# Patient Record
Sex: Female | Born: 1955 | Race: White | Hispanic: No | Smoking: Former smoker
Health system: Southern US, Community
[De-identification: ages and names within clinical notes are randomized; demographics above are authoritative.]

## PROBLEM LIST (undated history)

## (undated) DIAGNOSIS — N3941 Urge incontinence: Secondary | ICD-10-CM

## (undated) DIAGNOSIS — Z1371 Encounter for nonprocreative screening for genetic disease carrier status: Secondary | ICD-10-CM

## (undated) DIAGNOSIS — N3281 Overactive bladder: Secondary | ICD-10-CM

## (undated) DIAGNOSIS — I10 Essential (primary) hypertension: Secondary | ICD-10-CM

## (undated) DIAGNOSIS — M1990 Unspecified osteoarthritis, unspecified site: Secondary | ICD-10-CM

## (undated) DIAGNOSIS — R638 Other symptoms and signs concerning food and fluid intake: Secondary | ICD-10-CM

## (undated) DIAGNOSIS — N952 Postmenopausal atrophic vaginitis: Secondary | ICD-10-CM

## (undated) DIAGNOSIS — IMO0002 Reserved for concepts with insufficient information to code with codable children: Secondary | ICD-10-CM

## (undated) DIAGNOSIS — G4733 Obstructive sleep apnea (adult) (pediatric): Secondary | ICD-10-CM

## (undated) DIAGNOSIS — G5712 Meralgia paresthetica, left lower limb: Secondary | ICD-10-CM

## (undated) DIAGNOSIS — Z01818 Encounter for other preprocedural examination: Secondary | ICD-10-CM

## (undated) DIAGNOSIS — I890 Lymphedema, not elsewhere classified: Secondary | ICD-10-CM

## (undated) DIAGNOSIS — N816 Rectocele: Secondary | ICD-10-CM

## (undated) HISTORY — DX: Postmenopausal atrophic vaginitis: N95.2

## (undated) HISTORY — DX: Rectocele: N81.6

## (undated) HISTORY — DX: Reserved for concepts with insufficient information to code with codable children: IMO0002

## (undated) HISTORY — DX: Overactive bladder: N32.81

## (undated) HISTORY — DX: Urge incontinence: N39.41

## (undated) HISTORY — DX: Encounter for nonprocreative screening for genetic disease carrier status: Z13.71

## (undated) HISTORY — PX: TUBAL LIGATION: SHX77

## (undated) HISTORY — DX: Other symptoms and signs concerning food and fluid intake: R63.8

---

## 2002-03-13 ENCOUNTER — Encounter: Payer: Self-pay | Admitting: Internal Medicine

## 2003-04-14 ENCOUNTER — Encounter: Payer: Self-pay | Admitting: Internal Medicine

## 2005-07-06 ENCOUNTER — Ambulatory Visit: Payer: Self-pay | Admitting: Internal Medicine

## 2005-07-16 ENCOUNTER — Ambulatory Visit: Payer: Self-pay | Admitting: Internal Medicine

## 2005-07-16 ENCOUNTER — Encounter: Payer: Self-pay | Admitting: Internal Medicine

## 2007-03-06 ENCOUNTER — Encounter: Payer: Self-pay | Admitting: Internal Medicine

## 2007-04-03 ENCOUNTER — Ambulatory Visit: Payer: Self-pay | Admitting: Internal Medicine

## 2007-04-03 DIAGNOSIS — R252 Cramp and spasm: Secondary | ICD-10-CM

## 2007-04-18 ENCOUNTER — Encounter: Payer: Self-pay | Admitting: Internal Medicine

## 2007-04-20 ENCOUNTER — Encounter (INDEPENDENT_AMBULATORY_CARE_PROVIDER_SITE_OTHER): Payer: Self-pay | Admitting: *Deleted

## 2007-10-12 ENCOUNTER — Ambulatory Visit: Payer: Self-pay | Admitting: General Practice

## 2008-12-03 ENCOUNTER — Ambulatory Visit: Payer: Self-pay | Admitting: Family Medicine

## 2008-12-03 DIAGNOSIS — R42 Dizziness and giddiness: Secondary | ICD-10-CM

## 2008-12-03 DIAGNOSIS — H6120 Impacted cerumen, unspecified ear: Secondary | ICD-10-CM

## 2008-12-09 ENCOUNTER — Ambulatory Visit: Payer: Self-pay | Admitting: Family Medicine

## 2009-01-27 ENCOUNTER — Telehealth (INDEPENDENT_AMBULATORY_CARE_PROVIDER_SITE_OTHER): Payer: Self-pay | Admitting: Internal Medicine

## 2009-01-28 ENCOUNTER — Ambulatory Visit: Payer: Self-pay | Admitting: Family Medicine

## 2009-02-06 ENCOUNTER — Encounter: Payer: Self-pay | Admitting: Internal Medicine

## 2009-03-03 ENCOUNTER — Telehealth: Payer: Self-pay | Admitting: Internal Medicine

## 2009-03-11 ENCOUNTER — Encounter: Payer: Self-pay | Admitting: Internal Medicine

## 2009-03-13 ENCOUNTER — Encounter: Payer: Self-pay | Admitting: Internal Medicine

## 2009-04-16 ENCOUNTER — Ambulatory Visit: Payer: Self-pay | Admitting: Internal Medicine

## 2009-04-17 ENCOUNTER — Ambulatory Visit: Payer: Self-pay | Admitting: Family Medicine

## 2009-04-17 DIAGNOSIS — N390 Urinary tract infection, site not specified: Secondary | ICD-10-CM | POA: Insufficient documentation

## 2009-04-17 LAB — CONVERTED CEMR LAB
Bacteria, UA: 0
Bilirubin Urine: NEGATIVE
Glucose, Urine, Semiquant: NEGATIVE
Ketones, urine, test strip: NEGATIVE
Specific Gravity, Urine: 1.015
pH: 7

## 2009-04-28 ENCOUNTER — Ambulatory Visit: Payer: Self-pay | Admitting: Internal Medicine

## 2009-06-11 ENCOUNTER — Ambulatory Visit: Payer: Self-pay | Admitting: Internal Medicine

## 2009-06-11 DIAGNOSIS — G479 Sleep disorder, unspecified: Secondary | ICD-10-CM | POA: Insufficient documentation

## 2009-06-22 ENCOUNTER — Encounter: Payer: Self-pay | Admitting: Internal Medicine

## 2009-07-07 ENCOUNTER — Telehealth: Payer: Self-pay | Admitting: Family Medicine

## 2009-12-11 ENCOUNTER — Ambulatory Visit: Payer: Self-pay | Admitting: Family Medicine

## 2010-06-22 ENCOUNTER — Ambulatory Visit
Admission: RE | Admit: 2010-06-22 | Discharge: 2010-06-22 | Payer: Self-pay | Source: Home / Self Care | Attending: Family Medicine | Admitting: Family Medicine

## 2010-07-14 NOTE — Progress Notes (Signed)
Summary: Wants preventive med for flu  Phone Note Call from Patient Call back at [phone]   Caller: Patient Call For: Caitlin Salt MD Summary of Call: This is Dr. Karle Starch pt but she wants to get started on med today. Pt had flu shot and has been exposed to co worker who has confirmed diagnosis of the flu. Pt would like med called in as preventative med because her father has recurrent pheumonia and he does not need to be around someone with the flu. Pt uses [facility] [phone]. Please advise.  Initial call taken by: Lewanda Rife LPN,  July 07, 2009 2:52 PM  Follow-up for Phone Call        px written on EMR for call in - tamiflu for prophylaxis  f/u if any flu symptoms  Follow-up by: Judith Part MD,  July 07, 2009 3:28 PM  Additional Follow-up for Phone Call Additional follow up Details #1::        Med called to [facility], pt advised. Additional Follow-up by: Lowella Petties CMA,  July 07, 2009 3:40 PM    New/Updated Medications: TAMIFLU 75 MG CAPS (OSELTAMIVIR PHOSPHATE) 1 by mouth once daily for 10 days Prescriptions: TAMIFLU 75 MG CAPS (OSELTAMIVIR PHOSPHATE) 1 by mouth once daily for 10 days  #10 x 0   Entered and Authorized by:   Judith Part MD   Signed by:   Judith Part MD on 07/07/2009   Method used:   Telephoned to ...       [facility]* (retail)       [address]       [place], [place]  [address]       Ph: [phone]       Fax: [phone]   RxID:   [id number]

## 2010-07-14 NOTE — Letter (Signed)
Summary: Results Follow up Letter  [facility]  [address] [place], [place] [address]   Phone: [phone]  Fax: [phone]    06/22/2009 MRN: [id number]  Caitlin Romero [address] [place], [place]  [address]  Dear Ms. Sciuto,  The following are the results of your recent test(s):  Test         Result    Pap Smear:        Normal __X___  Not Normal _____ Comments: ______________________________________________________ Cholesterol: LDL(Bad cholesterol):         Your goal is less than:         HDL (Good cholesterol):       Your goal is more than: Comments:  ______________________________________________________ Mammogram:        Normal _____  Not Normal _____ Comments:  ___________________________________________________________________ Hemoccult:        Normal _____  Not normal _______ Comments:    _____________________________________________________________________ Other Tests:    We routinely do not discuss normal results over the telephone.  If you desire a copy of the results, or you have any questions about this information we can discuss them at your next office visit.   Sincerely,     Tillman Abide, MD

## 2010-07-14 NOTE — Letter (Signed)
Summary: Letter Regarding Biometric Screening Results/[facility]  Letter Regarding Biometric Screening Results/[facility]   Imported By: Lanelle Bal 06/16/2009 10:06:22  _____________________________________________________________________  External Attachment:    Type:   Image     Comment:   External Document

## 2010-07-14 NOTE — Assessment & Plan Note (Signed)
Summary: BUG BITE/JBB   Vital Signs:  Patient Profile:   55 Years Old Female CC:      Insect Bite. / rwt Height:     63 inches Weight:      182 pounds BMI:     32.36 Temp:     97.8 degrees F oral Pulse rate:   76 / minute Pulse rhythm:   regular Resp:     18 per minute BP sitting:   124 / 86  (left arm)  Pt. in pain?   yes    Location:   arm    Intensity:   2    Type:       aching  Vitals Entered By: Levonne Spiller EMT-P (December 11, 2009 3:32 PM)              Is Patient Diabetic? No      Current Allergies: No known allergies History of Present Illness History from: patient Reason for visit: see chief complaint Chief Complaint: Insect Bite. / rwt History of Present Illness: About 3 days ago patient felt something bite her on her left forearm. She did not see  what it was but it stung. Afterwards it began to itch. She put benadryl on it. By the next day the itching was more intense and today she woke up with swelling and redness around the bite area.   REVIEW OF SYSTEMS        Physical Exam General appearance: well developed, well nourished, no acute distress Skin: m-p rash with erythematous raised patch surrounding it.  MSE: oriented to time, place, and person Assessment New Problems: INSECT BITE, INFECTED (ICD-919.5)   Plan New Medications/Changes: LEVAQUIN 500 MG TABS (LEVOFLOXACIN) 1 by mouth daily for 7 days  #7 x 0, 12/11/2009, Tacey Ruiz MD LEVAQUIN 500 MG TABS (LEVOFLOXACIN) 1 by mouth daily for 7 days  #7 x 0, 12/11/2009, Tacey Ruiz MD  New Orders: Est. Patient Level II [[address]] Planning Comments:   Continue using hydrocortisone to area for itching. If it worsens or continues to spread - call or return.    Diagnoses and expected course of recovery discussed and will return if not improved as expected or if the condition worsens. Patient and/or caregiver verbalized understanding.  Prescriptions: LEVAQUIN 500 MG TABS (LEVOFLOXACIN) 1 by mouth daily for 7  days  #7 x 0   Entered and Authorized by:   Tacey Ruiz MD   Signed by:   Tacey Ruiz MD on 12/11/2009   Method used:   Electronically to        [facility]* (retail)       [address]       [place], [place]  [address]       Ph: [phone]       Fax: [phone]   RxID:   [id number] LEVAQUIN 500 MG TABS (LEVOFLOXACIN) 1 by mouth daily for 7 days  #7 x 0   Entered and Authorized by:   Tacey Ruiz MD   Signed by:   Tacey Ruiz MD on 12/11/2009   Method used:   Electronically to        [facility]* (retail)       [address]       [place], [place]  [address]       Ph: [phone]       Fax: [phone]   RxID:   [id number]  Orders Added: 1)  Est. Patient Level II [[address]]  The risks, benefits and possible side effects of the treatments and tests were explained clearly to the patient and the patient verbalized understanding.  The patient was informed that there is no on-call provider or services available at this clinic during off-hours (when the clinic is closed).  If the patient developed a problem or concern that required immediate attention, the patient was advised to go the the nearest available urgent care or emergency department for medical care.  The patient verbalized understanding.

## 2010-07-15 NOTE — Assessment & Plan Note (Signed)
Summary: EAR HURTING-RIGH,COUGH, CONGESTED/JBB   Vital Signs:  Patient profile:   55 year old female Height:      62 inches Weight:      188 pounds BMI:     34.51 O2 Sat:      98 % on Room air Temp:     97.1 degrees F oral Pulse rate:   73 / minute Pulse rhythm:   regular Resp:     20 per minute BP sitting:   144 / 88  (right arm)  Vitals Entered By: Levonne Spiller EMT-P (June 22, 2010 4:07 PM)  O2 Flow:  Room air CC: URI Is Patient Diabetic? No Pain Assessment Patient in pain? yes     Location: head Intensity: 2 Type: aching Onset of pain  Gradual  Does patient need assistance? Functional Status Self care Ambulation Normal   Chief Complaint:  URI.  History of Present Illness: The patient presented today because for the last 2 weeks she has been having some sinus pain, pressure and drainage.  She has had no fever or chills.  She feels like fluid in the head.  She has congestion and decreased hearing in the right ear.  She is having a dry cough at night.  She reports nasal congestion.  She is reporting sinus postnasal drainage and bad taste and halitosis.  No sneezing reported.    Allergies (verified): No Known Drug Allergies  Past History:  Past Medical History: Unremarkable  Past Surgical History: Reviewed history from 03/06/2007 and no changes required. Vaginal deliveries x 2 Tubal ligation (after 2nd) Lasik 2003 Mole removal- back negative 2003  Family History: Reviewed history from 03/06/2007 and no changes required. 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 Eyes:  Denies blurring, discharge, double vision, eye irritation, eye pain, halos, itching, light sensitivity, red eye, vision loss-1 eye, and vision loss-both eyes. ENT:  Complains of earache, nasal congestion, postnasal drainage, and sinus pressure; denies decreased hearing, difficulty swallowing, ear discharge, hoarseness, nosebleeds, ringing in ears, and sore throat. CV:  Denies bluish discoloration of lips or nails, chest pain or discomfort, difficulty breathing at night, difficulty breathing while lying down, fainting, fatigue, leg cramps with exertion, lightheadness, near fainting, palpitations, shortness of breath with exertion, swelling of feet, swelling of hands, and weight gain. Resp:  Complains of cough; denies chest discomfort, chest pain with inspiration, coughing up blood, excessive snoring, hypersomnolence, morning headaches, pleuritic, shortness of breath, sputum productive, and wheezing; Dry Cough. GI:  Denies abdominal pain, bloody stools, change in bowel habits, constipation, dark tarry stools, diarrhea, excessive appetite, gas, hemorrhoids, indigestion, loss of appetite, nausea, vomiting, vomiting blood, and yellowish skin color. GU:  Denies abnormal vaginal bleeding, decreased libido, discharge, dysuria, genital sores, hematuria, incontinence, nocturia, urinary frequency, and urinary hesitancy. MS:  Denies joint pain, joint redness, joint swelling, loss of strength, low back pain, mid back pain, muscle aches, muscle , cramps, muscle weakness, stiffness, and thoracic pain. Derm:  Denies changes in color of skin, changes in nail beds, dryness, excessive  perspiration, flushing, hair loss, insect bite(s), itching, lesion(s), poor wound healing, and rash. Neuro:  Complains of headaches; denies brief paralysis, difficulty with concentration, disturbances in  coordination, falling down, inability to speak, memory loss, numbness, poor balance, seizures, sensation of room spinning, tingling, tremors, visual disturbances, and weakness. Psych:  Denies alternate hallucination ( auditory/visual), anxiety, depression, easily angered, easily tearful, irritability, mental problems, panic attacks, sense of great danger, suicidal thoughts/plans, thoughts of violence, unusual visions or sounds, and thoughts /plans of harming others. Endo:  Denies cold intolerance, excessive hunger, excessive thirst, excessive urination, heat intolerance, polyuria, and weight change. Heme:  Denies abnormal bruising, bleeding, enlarge lymph nodes, fevers, pallor, and skin discoloration. Allergy:  Denies hives or rash, itching eyes, persistent infections, seasonal allergies, and sneezing.  Physical Exam  General:  Well-developed,well-nourished,in no acute distress; alert,appropriate and cooperative throughout examination Head:  Normocephalic and atraumatic without obvious abnormalities. No apparent alopecia or balding. Eyes:  No corneal or conjunctival inflammation noted. EOMI. Perrla. Funduscopic exam benign, without hemorrhages, exudates or papilledema. Vision grossly normal. Ears:  bilateral cerumen impaction noted Nose:  bilateral swollen nasal turbinates R>L. thick yellow mucus seen Mouth:  dry MM, post pharynx erythematous Neck:  supple and full ROM.   Lungs:  Normal respiratory effort, chest expands symmetrically. Lungs are clear to auscultation, no crackles or wheezes. Heart:  Normal rate and regular rhythm. S1 and S2 normal without gallop, murmur, click, rub or other extra sounds. Neurologic:  No cranial nerve deficits noted. Station and gait are normal. Plantar reflexes are down-going bilaterally. DTRs are symmetrical throughout. Sensory, motor and coordinative functions appear intact. Skin:  Intact without suspicious lesions or rashes Cervical Nodes:  No lymphadenopathy  noted Psych:  Cognition and judgment appear intact. Alert and cooperative with normal attention span and concentration. No apparent delusions, illusions, hallucinations   Impression & Recommendations:  Problem # 1:  ACUTE SINUSITIS, UNSPECIFIED (ICD-461.9)  Her updated medication list for this problem includes:    Guiatuss Ac 100-10 Mg/24ml Syrp (Guaifenesin-codeine) .Marland Kitchen... Take 1 teaspoon by mouth every 4-6 hours as needed severe cough:  caution will cause drowsiness .  Pt advised of this.  The patient verbalized clear understanding.      Amoxicillin 500 Mg Caps (Amoxicillin) .Marland Kitchen... Take 2 by mouth every 8 hours until completed    Fluticasone Propionate 50 Mcg/act Susp (Fluticasone propionate) .Marland Kitchen... 2 sprays per nostril once daily  Problem # 2:  ALLERGIC RHINITIS CAUSE UNSPECIFIED (ICD-477.9)  Her updated medication list for this problem includes:    Claritin 10 Mg Tabs (Loratadine) ..... Otc as directed    Fluticasone Propionate 50 Mcg/act Susp (Fluticasone propionate) .Marland Kitchen... 2 sprays per nostril once daily  Problem # 3:  CERUMEN IMPACTION, BILATERAL (ICD-380.4)  Orders: Cerumen Impaction Removal ([address]) Pt tolerated procedure well and  2 large balls of hardened wax removed from both ears. No complications.  Pt was advised to cleanse ears with Debrox OTC  1-2 times per month. The patient verbalized clear understanding.    Problem # 4:  ELEVATED BP READING WITHOUT DX HYPERTENSION (ICD-796.2)  Pt has been taking decongestants OTC at home.  Pt advised to monitor her BP closely and please return if BP higher than 140/90. The patient verbalized clear understanding.    Complete Medication List: 1)  Multivitamins Tabs (Multiple vitamin) .... Take one by mouth once a day 2)  Claritin 10 Mg Tabs (Loratadine) .... Otc as directed 3)  Cyclobenzaprine Hcl 10 Mg Tabs (Cyclobenzaprine hcl) .... 1/2-1 tab  at bedtime as needed for muscle spasm 4)  Tamiflu 75 Mg Caps (Oseltamivir phosphate) .Marland Kitchen.. 1 by  mouth once daily for 10 days 5)  Guiatuss Ac 100-10 Mg/109ml Syrp (Guaifenesin-codeine) .... Take 1 teaspoon by mouth every 4-6 hours as needed severe cough:  caution will cause drowsiness 6)  Amoxicillin 500 Mg Caps (Amoxicillin) .... Take 2 by mouth every 8 hours until completed 7)  Fluticasone Propionate 50 Mcg/act Susp (Fluticasone propionate) .... 2 sprays per nostril once daily  Patient Instructions: 1)  Go to the pharmacy and pick up your prescription (s).  It may take up to 30 mins for electronic prescriptions to be delivered to the pharmacy.  Please call if your pharmacy has not received your prescriptions after 30 minutes.  Stop taking the OTC medications.  2)  Take your antibiotic as prescribed until ALL of it is gone, but stop if you develop a rash or swelling and contact our office as soon as possible. 3)  Acute sinusitis symptoms for less than 10 days are not helped by antibiotics.Use warm moist compresses, and over the counter decongestants ( only as directed). Call if no improvement in 5-7 days, sooner if increasing pain, fever, or new symptoms. 4)  Return or go to the ER if no improvement or symptoms getting worse.   5)  The patient was informed that there is no on-call provider or services available at this clinic during off-hours (when the clinic is closed).  If the patient developed a problem or concern that required immediate attention, the patient was advised to go the the nearest available urgent care or emergency department for medical care.  The patient verbalized understanding.    Prescriptions: FLUTICASONE PROPIONATE 50 MCG/ACT SUSP (FLUTICASONE PROPIONATE) 2 sprays per nostril once daily  #1 x 0   Entered and Authorized by:   Standley Dakins MD   Signed by:   Standley Dakins MD on 06/22/2010   Method used:   Electronically to        [facility] [address]* (retail)       [address], [address]       [place], [place]  [address]       Ph:  [phone]       Fax: [phone]   RxID:   [id number] AMOXICILLIN 500 MG CAPS (AMOXICILLIN) take 2 by mouth every 8 hours until completed  #60 x 0   Entered and Authorized by:   Standley Dakins MD   Signed by:   Standley Dakins MD on 06/22/2010   Method used:   Electronically to        [facility] [address]* (retail)       [address], [address]       [place], [place]  [address]       Ph: [phone]       Fax: [phone]   RxID:   [id number] GUIATUSS AC 100-10 MG/5ML SYRP (GUAIFENESIN-CODEINE) take 1 teaspoon by mouth every 4-6 hours as needed severe cough:  Caution will cause drowsiness  #60 ML x 0   Entered and Authorized by:   Standley Dakins MD   Signed by:   Standley Dakins MD on 06/22/2010   Method used:   Print then Give to Patient   RxID:   [id number]    Orders Added: 1)  Cerumen Impaction Removal [[address]]

## 2011-12-15 ENCOUNTER — Ambulatory Visit: Payer: Self-pay | Admitting: Internal Medicine

## 2013-04-17 ENCOUNTER — Ambulatory Visit: Payer: Self-pay | Admitting: Internal Medicine

## 2013-05-07 ENCOUNTER — Ambulatory Visit (INDEPENDENT_AMBULATORY_CARE_PROVIDER_SITE_OTHER): Payer: 59 | Admitting: Internal Medicine

## 2013-05-07 ENCOUNTER — Encounter: Payer: Self-pay | Admitting: Internal Medicine

## 2013-05-07 VITALS — BP 128/80 | HR 83 | Temp 98.1°F | Ht 62.0 in | Wt 199.0 lb

## 2013-05-07 DIAGNOSIS — E669 Obesity, unspecified: Secondary | ICD-10-CM

## 2013-05-07 MED ORDER — PHENTERMINE HCL 37.5 MG PO CAPS: 37.5000 mg | ORAL_CAPSULE | ORAL | Status: DC

## 2013-05-07 NOTE — Progress Notes (Signed)
  Subjective:    Patient ID: Caitlin Romero, female    DOB: 1956/05/03, 57 y.o.   MRN: [id number]  HPI  Pt presents to the clinic today to establish care. She does have some concerns about her weight. She is not able to exercise due to left knee pain and plantar fasciitis. She has tried calorie control, with which she will "lose 5 obs and gain 10 back". She also has issues with stress at work and at home. She feels like this makes a negative impact on her diet. She would like to know what medication is available to help with her weight.  Review of Systems      History reviewed. No pertinent past medical history.  No current outpatient prescriptions on file.   No current facility-administered medications for this visit.    No Known Allergies  History reviewed. No pertinent family history.  History   Social History  . Marital Status: Divorced    Spouse Name: N/A    Number of Children: N/A  . Years of Education: N/A   Occupational History  . Not on file.   Social History Main Topics  . Smoking status: Never Smoker   . Smokeless tobacco: Never Used  . Alcohol Use: No  . Drug Use: No  . Sexual Activity: Not on file   Other Topics Concern  . Not on file   Social History Narrative  . No narrative on file     Constitutional: Denies fever, malaise, fatigue, headache or abrupt weight changes.  No other specific complaints in a complete review of systems (except as listed in HPI above).  Objective:   Physical Exam  BP 128/80  Pulse 83  Temp(Src) 98.1 F (36.7 C) (Tympanic)  Ht 5\' 2"  (1.575 m)  Wt 199 lb (90.266 kg)  BMI 36.39 kg/m2  SpO2 98% Wt Readings from Last 3 Encounters:  05/07/13 199 lb (90.266 kg)  06/22/10 188 lb (85.276 kg)  12/11/09 182 lb (82.555 kg)    General: Appears her stated age, obese but  well developed, well nourished in NAD. Cardiovascular: Normal rate and rhythm. S1,S2 noted.  No murmur, rubs or gallops noted. No JVD or BLE edema. No  carotid bruits noted. Pulmonary/Chest: Normal effort and positive vesicular breath sounds. No respiratory distress. No wheezes, rales or ronchi noted.        Assessment & Plan:   Obesity:  Discussed the importance of diet and exercise Pt declines referral to nutrition at this time Will try phentermine for 3-6 months RTC in 1 month for weight check  She has a physical scheduled 05/2013

## 2013-05-07 NOTE — Patient Instructions (Signed)

## 2013-05-07 NOTE — Progress Notes (Signed)
Pre-visit discussion using our clinic review tool. No additional management support is needed unless otherwise documented below in the visit note.  

## 2013-05-13 ENCOUNTER — Other Ambulatory Visit: Payer: Self-pay | Admitting: Internal Medicine

## 2013-05-13 DIAGNOSIS — Z0000 Encounter for general adult medical examination without abnormal findings: Secondary | ICD-10-CM

## 2013-05-23 ENCOUNTER — Other Ambulatory Visit (INDEPENDENT_AMBULATORY_CARE_PROVIDER_SITE_OTHER): Payer: 59

## 2013-05-23 DIAGNOSIS — Z0000 Encounter for general adult medical examination without abnormal findings: Secondary | ICD-10-CM

## 2013-05-24 LAB — COMPREHENSIVE METABOLIC PANEL
ALT: 11 IU/L (ref 0–32)
AST: 18 IU/L (ref 0–40)
Albumin/Globulin Ratio: 2 (ref 1.1–2.5)
Alkaline Phosphatase: 61 IU/L (ref 39–117)
BUN/Creatinine Ratio: 13 (ref 9–23)
Calcium: 10 mg/dL (ref 8.7–10.2)
Chloride: 98 mmol/L (ref 97–108)
Creatinine, Ser: 0.64 mg/dL (ref 0.57–1.00)
GFR calc non Af Amer: 99 mL/min/{1.73_m2} (ref >59)
Globulin, Total: 2.3 g/dL (ref 1.5–4.5)
Potassium: 4.9 mmol/L (ref 3.5–5.2)
Sodium: 139 mmol/L (ref 134–144)

## 2013-05-24 LAB — LIPID PANEL
Chol/HDL Ratio: 2.5 ratio units (ref 0.0–4.4)
Cholesterol, Total: 185 mg/dL (ref 100–199)
HDL: 75 mg/dL (ref >39)
LDL Calculated: 102 mg/dL — ABNORMAL HIGH (ref 0–99)
Triglycerides: 40 mg/dL (ref 0–149)
VLDL Cholesterol Cal: 8 mg/dL (ref 5–40)

## 2013-05-24 LAB — CBC WITH DIFFERENTIAL/PLATELET
Basophils Absolute: 0 10*3/uL (ref 0.0–0.2)
Eosinophils Absolute: 0.1 10*3/uL (ref 0.0–0.4)
Immature Grans (Abs): 0 10*3/uL (ref 0.0–0.1)
Immature Granulocytes: 0 %
Lymphs: 49 %
MCH: 30.3 pg (ref 26.6–33.0)
MCHC: 34.6 g/dL (ref 31.5–35.7)
MCV: 88 fL (ref 79–97)
Monocytes Absolute: 0.4 10*3/uL (ref 0.1–0.9)
Monocytes: 9 %
Neutrophils Absolute: 1.7 10*3/uL (ref 1.4–7.0)
Neutrophils Relative %: 38 %
RBC: 4.68 x10E6/uL (ref 3.77–5.28)
RDW: 12.6 % (ref 12.3–15.4)
WBC: 4.3 10*3/uL (ref 3.4–10.8)

## 2013-05-24 LAB — TSH: TSH: 3.03 u[IU]/mL (ref 0.450–4.500)

## 2013-05-30 ENCOUNTER — Encounter: Payer: Self-pay | Admitting: Internal Medicine

## 2013-05-30 ENCOUNTER — Ambulatory Visit (INDEPENDENT_AMBULATORY_CARE_PROVIDER_SITE_OTHER): Payer: 59 | Admitting: Internal Medicine

## 2013-05-30 ENCOUNTER — Other Ambulatory Visit (HOSPITAL_COMMUNITY)
Admission: RE | Admit: 2013-05-30 | Discharge: 2013-05-30 | Disposition: A | Discharge status: Home / Self Care | Payer: 59 | Source: Ambulatory Visit | Attending: Internal Medicine | Admitting: Internal Medicine

## 2013-05-30 VITALS — BP 126/76 | HR 76 | Temp 98.6°F | Ht 61.5 in | Wt 193.5 lb

## 2013-05-30 DIAGNOSIS — Z1151 Encounter for screening for human papillomavirus (HPV): Secondary | ICD-10-CM | POA: Insufficient documentation

## 2013-05-30 DIAGNOSIS — E669 Obesity, unspecified: Secondary | ICD-10-CM | POA: Insufficient documentation

## 2013-05-30 DIAGNOSIS — Z01419 Encounter for gynecological examination (general) (routine) without abnormal findings: Secondary | ICD-10-CM | POA: Insufficient documentation

## 2013-05-30 DIAGNOSIS — R32 Unspecified urinary incontinence: Secondary | ICD-10-CM | POA: Insufficient documentation

## 2013-05-30 DIAGNOSIS — Z0000 Encounter for general adult medical examination without abnormal findings: Secondary | ICD-10-CM

## 2013-05-30 DIAGNOSIS — Z23 Encounter for immunization: Secondary | ICD-10-CM

## 2013-05-30 MED ORDER — MIRABEGRON ER 25 MG PO TB24: 25.0000 mg | ORAL_TABLET | Freq: Every day | ORAL | Status: DC

## 2013-05-30 MED ORDER — PHENTERMINE HCL 37.5 MG PO CAPS: 37.5000 mg | ORAL_CAPSULE | ORAL | Status: DC

## 2013-05-30 NOTE — Progress Notes (Signed)
Pre-visit discussion using our clinic review tool. No additional management support is needed unless otherwise documented below in the visit note.  

## 2013-05-30 NOTE — Patient Instructions (Signed)

## 2013-05-30 NOTE — Assessment & Plan Note (Signed)
Will start myrbetriq

## 2013-05-30 NOTE — Assessment & Plan Note (Signed)
Lost 6.5 lbs Continue to work on diet and exercise

## 2013-05-30 NOTE — Progress Notes (Signed)
Subjective:    Patient ID: Caitlin Romero, female    DOB: 1956-04-08, 57 y.o.   MRN: [id number]  HPI  Pt presents to the clinic today for her annual exam and pap smear. She does have some concerns today about urinary incontinence. She "leaks" all the time. She wears a thick pad on a daily basis. This helps absorb the urine but she is not sure why she is having this issue at her age.  Flu: 2014 ([facility]) Tetanus: 2004 Pap Smear: 2010 Mammogram: 2010 Colon screening: 2010 Eye Doctor: yearly Dentist: biannually  Review of Systems      History reviewed. No pertinent past medical history.  Current Outpatient Prescriptions  Medication Sig Dispense Refill  . phentermine 37.5 MG capsule Take 1 capsule (37.5 mg total) by mouth every morning.  30 capsule  0   No current facility-administered medications for this visit.    No Known Allergies  Family History  Problem Relation Age of Onset  . Diabetes Father   . Cancer Maternal Grandmother     breast    History   Social History  . Marital Status: Divorced    Spouse Name: N/A    Number of Children: N/A  . Years of Education: N/A   Occupational History  . Not on file.   Social History Main Topics  . Smoking status: Never Smoker   . Smokeless tobacco: Never Used  . Alcohol Use: No  . Drug Use: No  . Sexual Activity: Not on file   Other Topics Concern  . Not on file   Social History Narrative  . No narrative on file     Constitutional: Denies fever, malaise, fatigue, headache or abrupt weight changes.  HEENT: Denies eye pain, eye redness, ear pain, ringing in the ears, wax buildup, runny nose, nasal congestion, bloody nose, or sore throat. Respiratory: Denies difficulty breathing, shortness of breath, cough or sputum production.   Cardiovascular: Denies chest pain, chest tightness, palpitations or swelling in the hands or feet.  Gastrointestinal: Denies abdominal pain, bloating, constipation, diarrhea or blood in  the stool.  GU: Denies urgency, frequency, pain with urination, burning sensation, blood in urine, odor or discharge. Musculoskeletal: Denies decrease in range of motion, difficulty with gait, muscle pain or joint pain and swelling.  Skin: Denies redness, rashes, lesions or ulcercations.  Neurological: Denies dizziness, difficulty with memory, difficulty with speech or problems with balance and coordination.   No other specific complaints in a complete review of systems (except as listed in HPI above).  Objective:   Physical Exam  BP 126/76  Pulse 76  Temp(Src) 98.6 F (37 C) (Oral)  Ht 5' 1.5" (1.562 m)  Wt 193 lb 8 oz (87.771 kg)  BMI 35.97 kg/m2  SpO2 98% Wt Readings from Last 3 Encounters:  05/30/13 193 lb 8 oz (87.771 kg)  05/07/13 199 lb (90.266 kg)  06/22/10 188 lb (85.276 kg)   Constitutional:  Alert, oriented x 4, well developed, well nourished in no apparent distress. Skin: Skin is warm and dry.  No erythema, lesion or ulceration noted. HEENT: Head: normal shape and size; Eyes: sclera white, no icterus, conjunctiva pink, PERRLA and EOMs intact; Ears: Tm's gray and intact, normal light reflex; Nose: mucosa pink and moist, septum midline; Throat/Mouth: Teeth present, , mucosa pink and moist, no lesions or ulcerations noted. Neck: Normal range of motion. Neck supple, trachea midline. No massses, lumps or thyromegaly present.  Cardiovascular: Normal rate and rhythm. S1,S2 noted.  No murmur, rubs or gallops noted. No JVD or BLE edema. No carotid bruits noted. Pulmonary/Chest: Normal effort and positive vesicular breath sounds. No respiratory distress. No wheezes, rales or ronchi noted.  Abdomenl: Soft and nontender. Normal bowel sounds, no bruits noted. No distention or masses noted. Liver, spleen and kidneys non palpable. Genitourinary: Normal female anatomy. Uterus midline, anterior and soft. No CMT or discharge noted. [place] non palpable. Breast without lumps or masses.    Musculoskeletal: Normal range of motion. Patient exhibits no effusions.  Neurological: Alert and oriented. Cranial nerves II-XII intact. Coordination normal. +DTRs bilaterally. Psychiatric: She has a normal mood and affect. Behavior is normal. Judgment and thought content normal.    BMET    Component Value Date/Time   NA 139 05/23/2013 0858   K 4.9 05/23/2013 0858   CL 98 05/23/2013 0858   CO2 25 05/23/2013 0858   GLUCOSE 94 05/23/2013 0858   BUN 8 05/23/2013 0858   CREATININE 0.64 05/23/2013 0858   CALCIUM 10.0 05/23/2013 0858   GFRNONAA 99 05/23/2013 0858   GFRAA 115 05/23/2013 0858    Lipid Panel     Component Value Date/Time   TRIG 40 05/23/2013 0858   HDL 75 05/23/2013 0858   CHOLHDL 2.5 05/23/2013 0858   LDLCALC 102* 05/23/2013 0858    CBC    Component Value Date/Time   WBC 4.3 05/23/2013 0858   RBC 4.68 05/23/2013 0858   HGB 14.2 05/23/2013 0858   HCT 41.0 05/23/2013 0858   MCV 88 05/23/2013 0858   MCH 30.3 05/23/2013 0858   MCHC 34.6 05/23/2013 0858   RDW 12.6 05/23/2013 0858   LYMPHSABS 2.1 05/23/2013 0858   EOSABS 0.1 05/23/2013 0858   BASOSABS 0.0 05/23/2013 0858    Hgb A1C No results found for this basename: HGBA1C         Assessment & Plan:   Preventative Health Maintenance:  Will get her Tdap today Labs reviewed-normal Will schedule her mammogram  RTC in 1 year or sooner if needed

## 2013-06-03 ENCOUNTER — Encounter: Payer: Self-pay | Admitting: Internal Medicine

## 2013-06-05 ENCOUNTER — Telehealth: Payer: Self-pay | Admitting: *Deleted

## 2013-06-05 NOTE — Telephone Encounter (Signed)
[employer] needs prior auth, forms placed in you inbox.

## 2013-06-12 ENCOUNTER — Other Ambulatory Visit: Payer: Self-pay | Admitting: Internal Medicine

## 2013-06-12 ENCOUNTER — Telehealth: Payer: Self-pay

## 2013-06-12 DIAGNOSIS — R32 Unspecified urinary incontinence: Secondary | ICD-10-CM

## 2013-06-12 DIAGNOSIS — IMO0002 Reserved for concepts with insufficient information to code with codable children: Secondary | ICD-10-CM

## 2013-06-12 NOTE — Telephone Encounter (Signed)
Pt left v/m at 1:59 pm; [facility] requires prior authorization and pt would like to get referral to urologist for urinary incontinence and dropped bladder.Please advise.

## 2013-08-15 DIAGNOSIS — R350 Frequency of micturition: Secondary | ICD-10-CM | POA: Insufficient documentation

## 2013-08-15 DIAGNOSIS — N3946 Mixed incontinence: Secondary | ICD-10-CM | POA: Insufficient documentation

## 2013-08-15 DIAGNOSIS — IMO0002 Reserved for concepts with insufficient information to code with codable children: Secondary | ICD-10-CM | POA: Insufficient documentation

## 2013-09-21 ENCOUNTER — Other Ambulatory Visit: Payer: Self-pay | Admitting: Internal Medicine

## 2014-04-25 ENCOUNTER — Ambulatory Visit: Payer: Self-pay | Admitting: Obstetrics and Gynecology

## 2014-05-13 ENCOUNTER — Ambulatory Visit: Payer: Self-pay | Admitting: Obstetrics and Gynecology

## 2015-10-20 ENCOUNTER — Encounter: Payer: Self-pay | Admitting: Obstetrics and Gynecology

## 2016-02-03 ENCOUNTER — Encounter: Payer: Self-pay | Admitting: Obstetrics and Gynecology

## 2016-02-24 ENCOUNTER — Encounter: Payer: Self-pay | Admitting: Gastroenterology

## 2016-03-07 NOTE — Progress Notes (Deleted)
ANNUAL PREVENTATIVE CARE GYN  ENCOUNTER NOTE  Subjective:       Caitlin Romero is a 60 y.o. G66P2002 female here for a routine annual gynecologic exam.  Current complaints: 1.      Gynecologic History No LMP recorded. Patient is postmenopausal. Contraception: tubal ligation Last Pap: 05/2013 neg/neg. Results were: normal Last mammogram: 05/2013 birad 2. Results were: normal  Obstetric History OB History  Gravida Para Term Preterm AB Living  _0 SAB TAB Ectopic Multiple Live Births          2    # Outcome Date GA Lbr Len/2nd Weight Sex Delivery Anes PTL Lv  2 Term 1983   5 lb 8 oz (2.495 kg) M Vag-Spont  Y LIV  1 Term 1981   6 lb 8 oz (2.948 kg) M Vag-Spont   LIV      Past Medical History:  Diagnosis Date  . BRCA negative    brca1/2 neg  . Cystocele   . Increased BMI   . OAB (overactive bladder)   . Rectocele   . Urge incontinence   . Vaginal atrophy     Past Surgical History:  Procedure Laterality Date  . TUBAL LIGATION      Current Outpatient Prescriptions on File Prior to Visit  Medication Sig Dispense Refill  . mirabegron ER (MYRBETRIQ) 25 MG TB24 tablet Take 1 tablet (25 mg total) by mouth daily. 30 tablet 0  . phentermine 37.5 MG capsule Take 1 capsule (37.5 mg total) by mouth every morning. 30 capsule 0   No current facility-administered medications on file prior to visit.     No Known Allergies  Social History   Social History  . Marital status: Divorced    Spouse name: N/A  . Number of children: N/A  . Years of education: N/A   Occupational History  . Not on file.   Social History Main Topics  . Smoking status: Former Research scientist (life sciences)  . Smokeless tobacco: Never Used  . Alcohol use Yes     Comment: occas  . Drug use: No  . Sexual activity: Not Currently    Birth control/ protection: Surgical   Other Topics Concern  . Not on file   Social History Narrative  . No narrative on file    Family History  Problem Relation Age of Onset   . Diabetes Father   . Breast cancer Maternal Grandmother   . Ovarian cancer Neg Hx   . Colon cancer Neg Hx   . Heart disease Neg Hx     The following portions of the patient's history were reviewed and updated as appropriate: allergies, current medications, past family history, past medical history, past social history, past surgical history and problem list.  Review of Systems ROS Review of Systems - General ROS: negative for - chills, fatigue, fever, hot flashes, night sweats, weight gain or weight loss Psychological ROS: negative for - anxiety, decreased libido, depression, mood swings, physical abuse or sexual abuse Ophthalmic ROS: negative for - blurry vision, eye pain or loss of vision ENT ROS: negative for - headaches, hearing change, visual changes or vocal changes Allergy and Immunology ROS: negative for - hives, itchy/watery eyes or seasonal allergies Hematological and Lymphatic ROS: negative for - bleeding problems, bruising, swollen lymph nodes or weight loss Endocrine ROS: negative for - galactorrhea, hair pattern changes, hot flashes, malaise/lethargy, mood swings, palpitations, polydipsia/polyuria, skin changes, temperature intolerance or unexpected weight changes Breast  ROS: negative for - new or changing breast lumps or nipple discharge Respiratory ROS: negative for - cough or shortness of breath Cardiovascular ROS: negative for - chest pain, irregular heartbeat, palpitations or shortness of breath Gastrointestinal ROS: no abdominal pain, change in bowel habits, or black or bloody stools Genito-Urinary ROS: no dysuria, trouble voiding, or hematuria Musculoskeletal ROS: negative for - joint pain or joint stiffness Neurological ROS: negative for - bowel and bladder control changes Dermatological ROS: negative for rash and skin lesion changes   Objective:   There were no vitals taken for this visit. CONSTITUTIONAL: Well-developed, well-nourished female in no acute  distress.  PSYCHIATRIC: Normal mood and affect. Normal behavior. Normal judgment and thought content. [place]: Alert and oriented to person, place, and time. Normal muscle tone coordination. No cranial nerve deficit noted. HENT:  Normocephalic, atraumatic, External right and left ear normal. Oropharynx is clear and moist EYES: Conjunctivae and EOM are normal. Pupils are equal, round, and reactive to light. No scleral icterus.  NECK: Normal range of motion, supple, no masses.  Normal thyroid.  SKIN: Skin is warm and dry. No rash noted. Not diaphoretic. No erythema. No pallor. CARDIOVASCULAR: Normal heart rate noted, regular rhythm, no murmur. RESPIRATORY: Clear to auscultation bilaterally. Effort and breath sounds normal, no problems with respiration noted. BREASTS: Symmetric in size. No masses, skin changes, nipple drainage, or lymphadenopathy. ABDOMEN: Soft, normal bowel sounds, no distention noted.  No tenderness, rebound or guarding.  BLADDER: Normal PELVIC:  External Genitalia: Normal  BUS: Normal  Vagina: Normal  Cervix: Normal  Uterus: Normal  Adnexa: Normal  RV: {Blank multiple:19196::"External Exam NormaI","No Rectal Masses","Normal Sphincter tone"}  MUSCULOSKELETAL: Normal range of motion. No tenderness.  No cyanosis, clubbing, or edema.  2+ distal pulses. LYMPHATIC: No Axillary, Supraclavicular, or Inguinal Adenopathy.    Assessment:   Annual gynecologic examination 60 y.o. Contraception: tubal ligation Obesity 1 Problem List Items Addressed This Visit    Obesity, unspecified    Other Visit Diagnoses    Well woman exam with routine gynecological exam    -  Primary   Rectocele       Urge incontinence          Plan:  Pap: Pap Co Test Mammogram: Ordered Stool Guaiac Testing:  Ordered Labs: Lipid 1, FBS, TSH, Hemoglobin A1C and Vit D Level"". Routine preventative health maintenance measures emphasized: {Blank multiple:19196::"Exercise/Diet/Weight control","Tobacco  Warnings","Alcohol/Substance use risks","Stress Management","Peer Pressure Issues","Safe Sex"} *** Return to [place], [place]

## 2016-03-09 ENCOUNTER — Encounter: Payer: Self-pay | Admitting: Obstetrics and Gynecology

## 2016-05-16 NOTE — Progress Notes (Deleted)
ANNUAL PREVENTATIVE CARE GYN  ENCOUNTER NOTE  Subjective:       Caitlin Romero is a 60 y.o. G62P2002 female here for a routine annual gynecologic exam.  Current complaints: 1.      Gynecologic History No LMP recorded. Patient is postmenopausal. Contraception: post menopausal status Last Pap: 10/2014 neg/neg. Results were: normal Last mammogram: 05/2013 [facility]- birad 2. Results were: normal  Obstetric History OB History  Gravida Para Term Preterm AB Living  '2 2 2     2  '$ SAB TAB Ectopic Multiple Live Births          2    # Outcome Date GA Lbr Len/2nd Weight Sex Delivery Anes PTL Lv  2 Term 1983   5 lb 8 oz (2.495 kg) M Vag-Spont  Y LIV  1 Term 1981   6 lb 8 oz (2.948 kg) M Vag-Spont   LIV      Past Medical History:  Diagnosis Date  . BRCA negative    brca1/2 neg  . Cystocele   . Increased BMI   . OAB (overactive bladder)   . Rectocele   . Urge incontinence   . Vaginal atrophy     Past Surgical History:  Procedure Laterality Date  . TUBAL LIGATION      Current Outpatient Prescriptions on File Prior to Visit  Medication Sig Dispense Refill  . mirabegron ER (MYRBETRIQ) 25 MG TB24 tablet Take 1 tablet (25 mg total) by mouth daily. 30 tablet 0  . phentermine 37.5 MG capsule Take 1 capsule (37.5 mg total) by mouth every morning. 30 capsule 0   No current facility-administered medications on file prior to visit.     No Known Allergies  Social History   Social History  . Marital status: Divorced    Spouse name: N/A  . Number of children: N/A  . Years of education: N/A   Occupational History  . Not on file.   Social History Main Topics  . Smoking status: Former Research scientist (life sciences)  . Smokeless tobacco: Never Used  . Alcohol use Yes     Comment: occas  . Drug use: No  . Sexual activity: Not Currently    Birth control/ protection: Surgical   Other Topics Concern  . Not on file   Social History Narrative  . No narrative on file    Family History  Problem Relation  Age of Onset  . Diabetes Father   . Breast cancer Maternal Grandmother   . Ovarian cancer Neg Hx   . Colon cancer Neg Hx   . Heart disease Neg Hx     The following portions of the patient's history were reviewed and updated as appropriate: allergies, current medications, past family history, past medical history, past social history, past surgical history and problem list.  Review of Systems ROS Review of Systems - General ROS: negative for - chills, fatigue, fever, hot flashes, night sweats, weight gain or weight loss Psychological ROS: negative for - anxiety, decreased libido, depression, mood swings, physical abuse or sexual abuse Ophthalmic ROS: negative for - blurry vision, eye pain or loss of vision ENT ROS: negative for - headaches, hearing change, visual changes or vocal changes Allergy and Immunology ROS: negative for - hives, itchy/watery eyes or seasonal allergies Hematological and Lymphatic ROS: negative for - bleeding problems, bruising, swollen lymph nodes or weight loss Endocrine ROS: negative for - galactorrhea, hair pattern changes, hot flashes, malaise/lethargy, mood swings, palpitations, polydipsia/polyuria, skin changes, temperature intolerance or unexpected weight  changes Breast ROS: negative for - new or changing breast lumps or nipple discharge Respiratory ROS: negative for - cough or shortness of breath Cardiovascular ROS: negative for - chest pain, irregular heartbeat, palpitations or shortness of breath Gastrointestinal ROS: no abdominal pain, change in bowel habits, or black or bloody stools Genito-Urinary ROS: no dysuria, trouble voiding, or hematuria Musculoskeletal ROS: negative for - joint pain or joint stiffness Neurological ROS: negative for - bowel and bladder control changes Dermatological ROS: negative for rash and skin lesion changes   Objective:   There were no vitals taken for this visit. CONSTITUTIONAL: Well-developed, well-nourished female in no  acute distress.  PSYCHIATRIC: Normal mood and affect. Normal behavior. Normal judgment and thought content. [place]: Alert and oriented to person, place, and time. Normal muscle tone coordination. No cranial nerve deficit noted. HENT:  Normocephalic, atraumatic, External right and left ear normal. Oropharynx is clear and moist EYES: Conjunctivae and EOM are normal. Pupils are equal, round, and reactive to light. No scleral icterus.  NECK: Normal range of motion, supple, no masses.  Normal thyroid.  SKIN: Skin is warm and dry. No rash noted. Not diaphoretic. No erythema. No pallor. CARDIOVASCULAR: Normal heart rate noted, regular rhythm, no murmur. RESPIRATORY: Clear to auscultation bilaterally. Effort and breath sounds normal, no problems with respiration noted. BREASTS: Symmetric in size. No masses, skin changes, nipple drainage, or lymphadenopathy. ABDOMEN: Soft, normal bowel sounds, no distention noted.  No tenderness, rebound or guarding.  BLADDER: Normal PELVIC:  External Genitalia: Normal  BUS: Normal  Vagina: Normal  Cervix: Normal  Uterus: Normal  Adnexa: Normal  RV: {Blank multiple:19196::"External Exam NormaI","No Rectal Masses","Normal Sphincter tone"}  MUSCULOSKELETAL: Normal range of motion. No tenderness.  No cyanosis, clubbing, or edema.  2+ distal pulses. LYMPHATIC: No Axillary, Supraclavicular, or Inguinal Adenopathy.    Assessment:   Annual gynecologic examination 60 y.o. Contraception: post menopausal status Obesity 1 Problem List Items Addressed This Visit    None    Visit Diagnoses    Well woman exam with routine gynecological exam    -  Primary   Rectocele       Vaginal atrophy       Cystocele, unspecified (CODE)       BRCA negative       Screen for colon cancer       Screening for breast cancer          Plan:  Pap: due 2019 Mammogram: Ordered Stool Guaiac Testing:  Ordered Labs: ? Routine preventative health maintenance measures emphasized:  {Blank multiple:19196::"Exercise/Diet/Weight control","Tobacco Warnings","Alcohol/Substance use risks","Stress Management","Peer Pressure Issues","Safe Sex"} *** Return to [place], [place]

## 2016-05-19 ENCOUNTER — Encounter: Payer: Self-pay | Admitting: Obstetrics and Gynecology

## 2017-08-14 ENCOUNTER — Ambulatory Visit (INDEPENDENT_AMBULATORY_CARE_PROVIDER_SITE_OTHER): Payer: BLUE CROSS/BLUE SHIELD | Admitting: Podiatry

## 2017-08-14 ENCOUNTER — Ambulatory Visit (INDEPENDENT_AMBULATORY_CARE_PROVIDER_SITE_OTHER): Payer: BLUE CROSS/BLUE SHIELD

## 2017-08-14 ENCOUNTER — Encounter: Payer: Self-pay | Admitting: Podiatry

## 2017-08-14 VITALS — BP 135/81 | HR 65

## 2017-08-14 DIAGNOSIS — S92515A Nondisplaced fracture of proximal phalanx of left lesser toe(s), initial encounter for closed fracture: Secondary | ICD-10-CM | POA: Diagnosis not present

## 2017-08-14 DIAGNOSIS — S99922A Unspecified injury of left foot, initial encounter: Secondary | ICD-10-CM

## 2017-08-14 NOTE — Progress Notes (Signed)
   Subjective:    Patient ID: Steele SizerKaren Y Nicolaisen, female    DOB: 27-Aug-1955, 62 y.o.   MRN: [id number]  HPIthis patient presents to the office for an evaluation of her second toe left foot.  She says she hit the toe on a chair in October and it has not been right since.  She says she has difficulty wearing sandals and high heels on the second toe.  She also says the second toe isn the other .  She has tried stretching and bending per  her friends recommendations but the  the problem persists.  She presents for evaluation and treatment of this second toe left foot.    Review of Systems  All other systems reviewed and are negative.      Objective:   Physical Exam General Appearance  Alert, conversant and in no acute stress.  Vascular  Dorsalis pedis and posterior tibial  pulses are palpable  bilaterally.  Capillary return is within normal limits  bilaterally. Temperature is within normal limits  bilaterally.  Neurologic  Senn-Weinstein monofilament wire test within normal limits  bilaterally. Muscle power within normal limits bilaterally.  Nails Normal nails noted with no signs of bacterial or fungal infection.  Orthopedic  No limitations of motion of motion feet .  No crepitus or effusions noted.  No bony pathology or digital deformities noted. Painful, swollen area at the PIPJ second toe left foot. Upon palpation.  Skin  normotropic skin with no porokeratosis noted bilaterally.  No signs of infections or ulcers noted.          Assessment & Plan:  Closed fracture second toe left  X 5 months  IE  X-rays reveal a healed fracture at the head of the proximal phalanx second toe left foot.  Good alignment is present at the PIPJ second toe left foot.  Buddy taping of the second to the third toe was performed.  Patient was told to take Advil and/or acetaminophen. If pain persists  .RTC  Prn   Helane GuntherGregory Jacinta Penalver DPM

## 2017-08-14 NOTE — Progress Notes (Addendum)
   Subjective:    Patient ID: Caitlin Romero, female    DOB: May 23, 1956, 62 y.o.   MRN: [id number]  HPIsee above note.    Review of Systems     Objective:   Physical Exam        Assessment & Plan:  Helane GuntherGregory Mayer DPM

## 2019-03-21 ENCOUNTER — Encounter: Payer: Self-pay | Admitting: Gastroenterology

## 2019-03-27 ENCOUNTER — Encounter: Payer: Self-pay | Admitting: Gastroenterology

## 2021-04-21 ENCOUNTER — Other Ambulatory Visit: Payer: Self-pay | Admitting: Sports Medicine

## 2021-04-21 DIAGNOSIS — M25561 Pain in right knee: Secondary | ICD-10-CM

## 2021-04-21 DIAGNOSIS — M25461 Effusion, right knee: Secondary | ICD-10-CM

## 2021-04-21 DIAGNOSIS — S8991XA Unspecified injury of right lower leg, initial encounter: Secondary | ICD-10-CM

## 2021-04-29 ENCOUNTER — Other Ambulatory Visit: Payer: Self-pay

## 2021-04-29 ENCOUNTER — Ambulatory Visit
Admission: RE | Admit: 2021-04-29 | Discharge: 2021-04-29 | Disposition: A | Discharge status: Home / Self Care | Payer: Medicare Other | Source: Ambulatory Visit | Attending: Sports Medicine | Admitting: Sports Medicine

## 2021-04-29 DIAGNOSIS — S8991XA Unspecified injury of right lower leg, initial encounter: Secondary | ICD-10-CM | POA: Insufficient documentation

## 2021-04-29 DIAGNOSIS — M25461 Effusion, right knee: Secondary | ICD-10-CM | POA: Diagnosis present

## 2021-04-29 DIAGNOSIS — M25561 Pain in right knee: Secondary | ICD-10-CM | POA: Insufficient documentation

## 2022-01-13 IMAGING — MR MR KNEE*R* W/O CM
6 series · 40 of 40 positions shown · non-contrast
Comparison: None.

CLINICAL DATA: Right knee pain medial to patella with swelling x 6
weeks.

EXAM:
MRI OF THE RIGHT KNEE WITHOUT CONTRAST
TECHNIQUE: Multiplanar, multisequence MR imaging of the knee was performed. No
intravenous contrast was administered.

[Series 8: T2 fat-sat · axial · right · 4.0mm · 0.50mm/px · z∈[-31,+92]mm · 5 of 26 slices shown (1 of 3)]
[im 1/26]
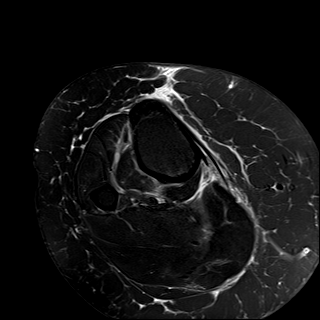
[im 7/26]
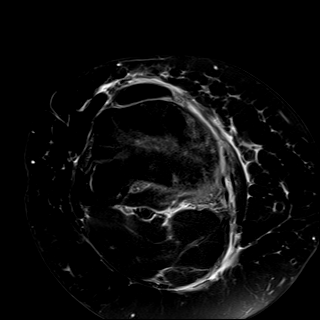
[im 13/26]
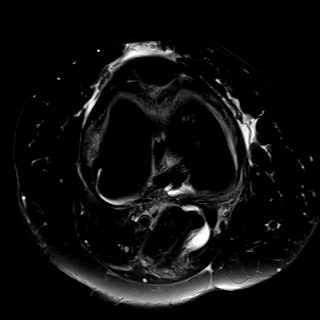
[im 19/26]
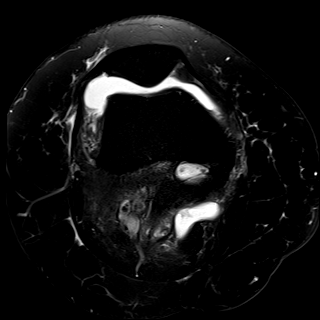
[im 26/26]
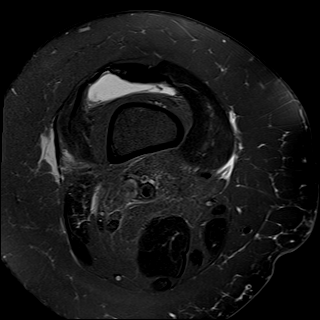

[Series 9: T2 fat-sat · coronal · right · 4.0mm · 0.59mm/px · 6 of 29 slices shown (2 of 3)]
[im 1/29]
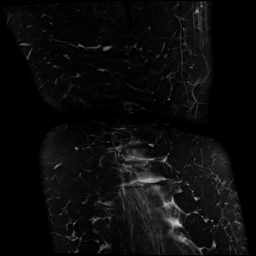
[im 6/29]
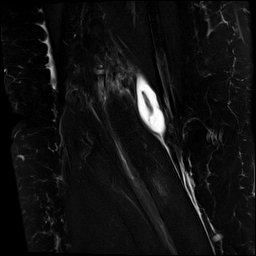
[im 12/29]
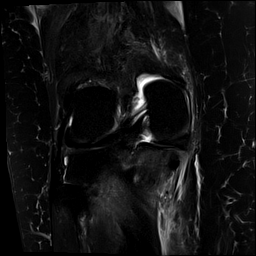
[im 17/29]
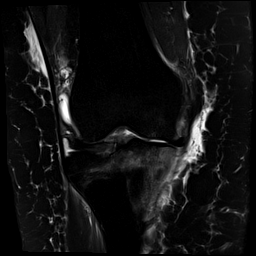
[im 23/29]
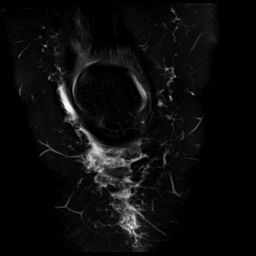
[im 29/29]
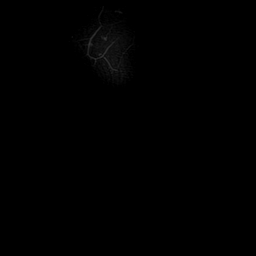

[Series 10: T1 · coronal · right · 4.0mm · 0.59mm/px · 7 of 30 slices shown]
[im 1/30]
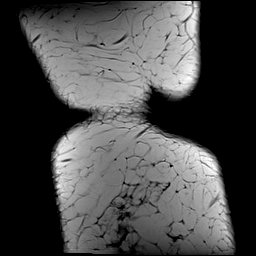
[im 5/30]
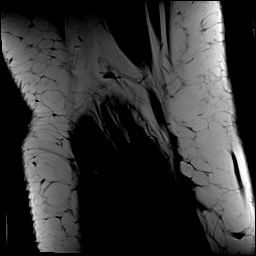
[im 10/30]
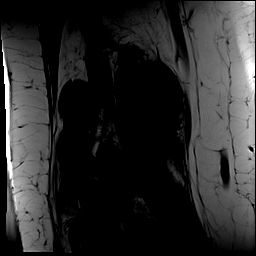
[im 15/30]
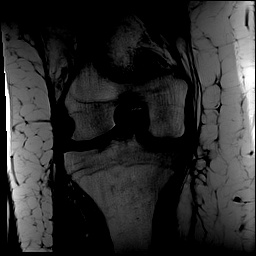
[im 20/30]
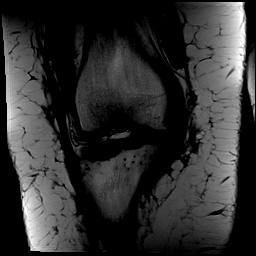
[im 25/30]
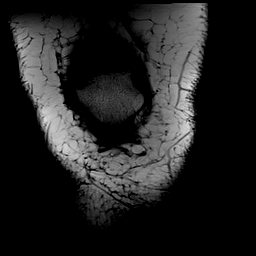
[im 30/30]
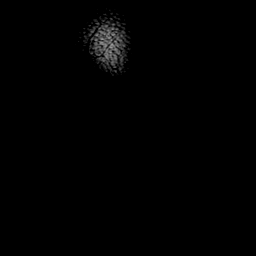

[Series 11: PD fat-sat · coronal · right · 4.0mm · 0.59mm/px · 7 of 30 slices shown (1 of 2)]
[im 1/30]
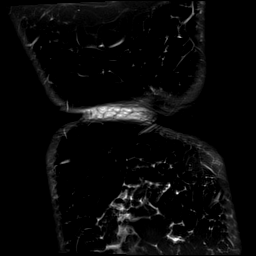
[im 5/30]
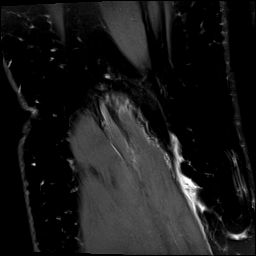
[im 10/30]
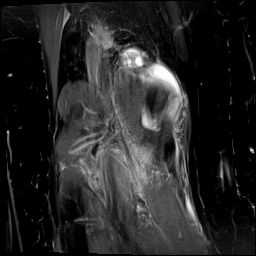
[im 15/30]
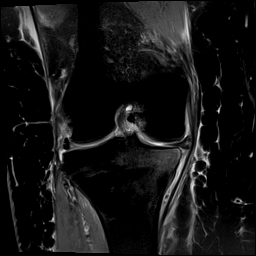
[im 20/30]
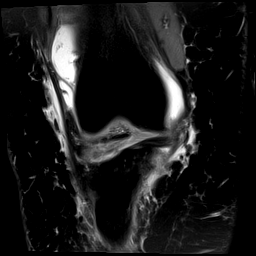
[im 25/30]
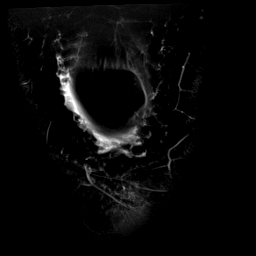
[im 30/30]
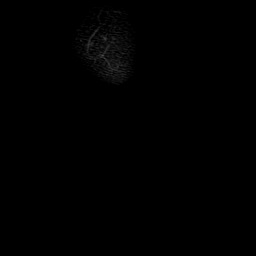

[Series 12: PD fat-sat · sagittal · right · 3.0mm · 0.59mm/px · 7 of 34 slices shown (2 of 2)]
[im 1/34]
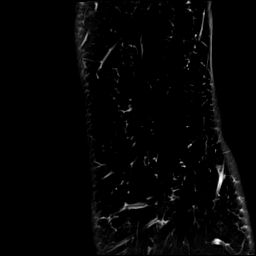
[im 6/34]
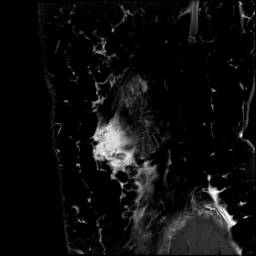
[im 12/34]
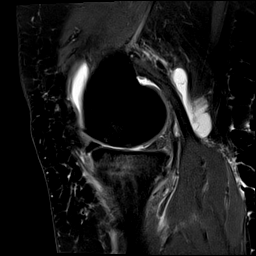
[im 17/34]
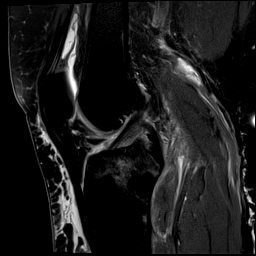
[im 23/34]
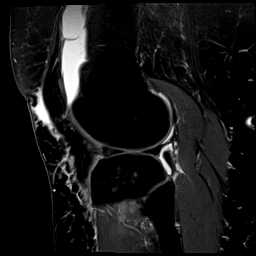
[im 28/34]
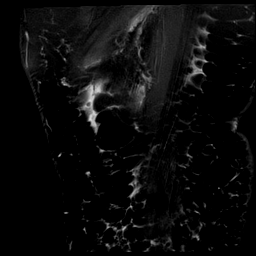
[im 34/34]
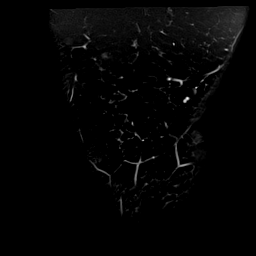

[Series 13: T2 fat-sat · sagittal · right · 3.0mm · 0.59mm/px · 8 of 35 slices shown (3 of 3)]
[im 1/35]
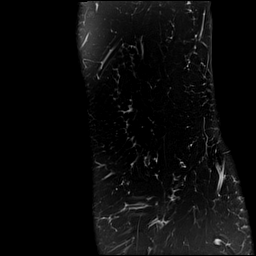
[im 5/35]
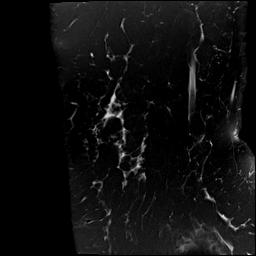
[im 10/35]
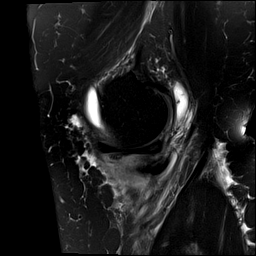
[im 15/35]
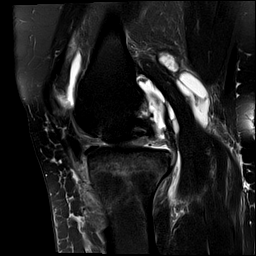
[im 20/35]
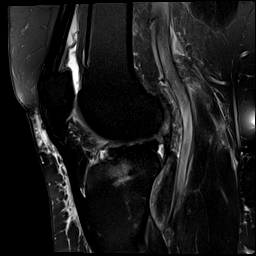
[im 25/35]
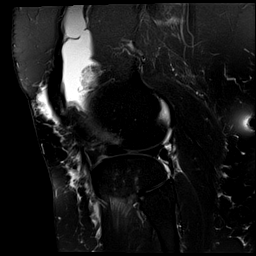
[im 30/35]
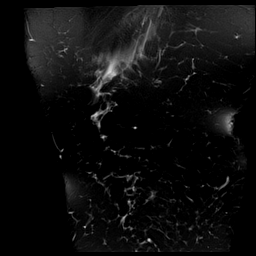
[im 35/35]
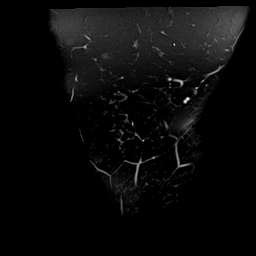

[40 of 40 positions shown; findings below may reference images not displayed]

FINDINGS: MENISCI

Medial meniscus: Radial tear of the posterior horn of the medial
meniscus towards the meniscal root with peripheral meniscal
extrusion. Degeneration of the posterior horn of the medial
meniscus.

Lateral meniscus: Degeneration of the anterior horn of the lateral
meniscus. Small radial tear of the free edge of the body of the
lateral meniscus.

LIGAMENTS

Cruciates: Intact ACL and PCL.

Collaterals: Medial collateral ligament is intact. Lateral
collateral ligament complex is intact.

CARTILAGE

Patellofemoral: No chondral defect.

Medial: High-grade partial-thickness cartilage loss of the medial
femorotibial compartment.

Lateral: Partial-thickness cartilage loss with areas of high-grade
partial-thickness cartilage loss of the lateral femorotibial
compartment.

Joint: Large joint effusion. Normal Hoffa's fat. No plical
thickening.

Popliteal Fossa: Small amount of fluid superficial to the medial
gastrocnemius muscle, likely reflecting a small ruptured Baker's
cyst. Intact popliteus tendon.

Extensor Mechanism: Intact quadriceps tendon and patellofemoral
tendon.

Bones: Severe subchondral bone marrow edema in the medial tibial
plateau disproportionate to the degree of overlying cartilage loss
and with a subtle subchondral linear low signal area concerning for
a developing subchondral insufficiency fracture without displacement
or depression.

Other: Muscles are normal. No fluid collection or hematoma. Soft
tissue edema along the medial tibial metaphysis.
IMPRESSION: 1. Developing nondisplaced, nondepressed subchondral insufficiency
fracture of the medial tibial plateau with severe bone marrow edema.
2. Radial tear of the posterior horn of the medial meniscus towards
the meniscal root with peripheral meniscal extrusion. Degeneration
of the posterior horn of the medial meniscus.
3. Degeneration of the anterior horn of the lateral meniscus. Small
radial tear of the free edge of the body of the lateral meniscus.
4. High-grade partial-thickness cartilage loss of the medial
femorotibial compartment.
5. Partial-thickness cartilage loss with areas of high-grade
partial-thickness cartilage loss of the lateral femorotibial
compartment.

## 2023-10-23 ENCOUNTER — Ambulatory Visit: Payer: Self-pay

## 2023-10-23 DIAGNOSIS — Z83719 Family history of colon polyps, unspecified: Secondary | ICD-10-CM | POA: Diagnosis not present

## 2023-10-23 DIAGNOSIS — K642 Third degree hemorrhoids: Secondary | ICD-10-CM | POA: Diagnosis not present

## 2023-10-23 DIAGNOSIS — Z1211 Encounter for screening for malignant neoplasm of colon: Secondary | ICD-10-CM | POA: Diagnosis present

## 2023-10-23 DIAGNOSIS — K5730 Diverticulosis of large intestine without perforation or abscess without bleeding: Secondary | ICD-10-CM

## 2023-11-10 NOTE — H&P (Signed)
 Caitlin Romero is a 68 y.o. female here for TVH and BSO and Anterior and posterior colporrhaphy. Pt here after #5 cube pessary fell out . She has POP   Not sexually active  Failed cube pessary and desires pessary   Started on vesicare which has helped with 70% reduction in urge and noctoria    Past Medical History:  has a past medical history of Bladder incontinence, OAB (overactive bladder), Rectocele, and Vaginal atrophy.  Past Surgical History:  has a past surgical history that includes Tubal ligation; Colonoscopy (06/22/2018); and cataract surgery (Bilateral). Family History: family history includes Breast cancer in her maternal grandmother; COPD in her mother; Coronary Artery Disease (Blocked arteries around heart) in her father; Diabetes type II in her father; Glaucoma in her sister; Hearing loss in her mother; Heart disease in her mother; High blood pressure (Hypertension) in her brother; Leukemia in her maternal grandmother; Meniere's disease in her sister; Multiple myeloma in her mother; Myocardial Infarction (Heart attack) in her father; Parkinsonism in her father; Reflux disease in her mother; Thyroid disease in her mother. Social History:  reports that she has quit smoking. Her smoking use included cigarettes. She has a 30 pack-year smoking history. She has never been exposed to tobacco smoke. She has never used smokeless tobacco. She reports current alcohol use. She reports that she does not use drugs. OB/GYN History:  OB History       Gravida  2   Para  2   Term  2   Preterm      AB      Living  2        SAB      IAB      Ectopic      Molar      Multiple      Live Births  2             Allergies: has No Known Allergies. Medications:  Current Medications    Current Outpatient Medications:    acetaminophen (TYLENOL) 500 mg capsule, Take 2 capsules (1,000 mg total) by mouth every 6 (six) hours as needed for Pain, Disp: , Rfl:    BIOTIN ORAL, Take by mouth,  Disp: , Rfl:    C,E,zinc,copper 11-omega3s-lut 250-5-1 mg Cap, Take by mouth once daily, Disp: , Rfl:    calcium citrate/vitamin D3 (CITRACAL + D ORAL), Take by mouth once daily, Disp: , Rfl:    COLLAGEN MISC, Use Peptide, Disp: , Rfl:    docosahexaenoic acid/epa (FISH OIL ORAL), Take by mouth One daily, Disp: , Rfl:    ibuprofen 200 mg Cap, Take by mouth 400 mg as needed, Disp: , Rfl:    lisinopriL-hydroCHLOROthiazide (ZESTORETIC) 10-12.5 mg tablet, TAKE 1 TABLET BY MOUTH DAILY, Disp: 90 tablet, Rfl: 3   loratadine (CLARITIN) 10 mg tablet, Take 10 mg by mouth once daily, Disp: , Rfl:    multivitamin tablet, Take 1 tablet by mouth once daily, Disp: , Rfl:    naproxen sodium (ALEVE ORAL), Take by mouth, Disp: , Rfl:    solifenacin (VESICARE) 5 MG tablet, Take 1 tablet (5 mg total) by mouth once daily, Disp: 30 tablet, Rfl: 11   tirzepatide (ZEPBOUND) 10 mg/0.5 mL injection, Inject 10 mg subcutaneously every 7 (seven) days, Disp: , Rfl:    [START ON 10/12/2023] estradioL (ESTRACE) 0.01 % (0.1 mg/gram) vaginal cream, 1/4 app per vagina every other day until surgery, Disp: 30 g, Rfl: 3   semaglutide (WEGOVY) 2.4 mg/0.75 mL  pen injector, Inject 0.75 mLs (2.4 mg total) subcutaneously once a week for 180 days, Disp: 3 mL, Rfl: 5     Review of Systems: General:                      No fatigue or weight loss Eyes:                           No vision changes Ears:                            No hearing difficulty Respiratory:                No cough or shortness of breath Pulmonary:                  No asthma or shortness of breath Cardiovascular:           No chest pain, palpitations, dyspnea on exertion Gastrointestinal:          No abdominal bloating, chronic diarrhea, constipations, masses, pain or hematochezia Genitourinary:             No hematuria, dysuria, abnormal vaginal discharge, pelvic pain, Menometrorrhagia,+ pelvic pressure  Lymphatic:                   No swollen lymph  nodes Musculoskeletal:No muscle weakness Neurologic:                  No extremity weakness, syncope, seizure disorder Psychiatric:                  No history of depression, delusions or suicidal/homicidal ideation      Exam:       Vitals:    11/13/23 1506  BP: 98/66  Pulse: 76      Body mass index is 31.14 kg/m.   WDWN white/  female in NAD   Lungs: CTA  CV : RRR without murmur   Neck:  no thyromegaly Abdomen: soft , no mass, normal active bowel sounds,  non-tender, no rebound tenderness Pelvic: tanner stage 5 ,  External genitalia: vulva /labia no lesions Urethra: no prolapse Vagina: normal physiologic d/c 3rd degree cystocele with valsalva 1-2 rectocele with valsalva Cervix: no lesions, no cervical motion tenderness   Uterus: second - third degree rectocele with valsalva  normal size shape and contour, non-tender Adnexa: no mass,  non-tender   Rectovaginal:    Impression:    The primary encounter diagnosis was Cystocele, midline. Diagnoses of Uterus descensus, Rectocele, and OAB (overactive bladder) were also pertinent to this visit.       Plan:      Spoke to the patient about surgical repair  TVH , BSO if possible  Anterior and posterior colporrhaphy  Start estrace 1/4 applicator qod until surgery  Continue with vesicare    See [facility] notes for risks discussed  Cortez Dines, MD

## 2023-11-15 ENCOUNTER — Encounter
Admission: RE | Admit: 2023-11-15 | Discharge: 2023-11-15 | Disposition: A | Discharge status: Home / Self Care | Source: Ambulatory Visit | Attending: Obstetrics and Gynecology | Admitting: Obstetrics and Gynecology

## 2023-11-15 ENCOUNTER — Other Ambulatory Visit: Payer: Self-pay

## 2023-11-15 VITALS — Ht 61.5 in | Wt 167.0 lb

## 2023-11-15 DIAGNOSIS — Z01810 Encounter for preprocedural cardiovascular examination: Secondary | ICD-10-CM

## 2023-11-15 DIAGNOSIS — I10 Essential (primary) hypertension: Secondary | ICD-10-CM

## 2023-11-15 HISTORY — DX: Essential (primary) hypertension: I10

## 2023-11-15 HISTORY — DX: Obstructive sleep apnea (adult) (pediatric): G47.33

## 2023-11-15 NOTE — Patient Instructions (Signed)
 Your procedure is scheduled on:11-23-23 Thursday Report to the Registration Desk on the 1st floor of the [facility].Then proceed to the 2nd floor Surgery Desk To find out your arrival time, please call [phone] between 1PM - 3PM on:11-22-23 Wednesday If your arrival time is 6:00 am, do not arrive before that time as the [facility] entrance doors do not open until 6:00 am.  REMEMBER: Instructions that are not followed completely may result in serious medical risk, up to and including death; or upon the discretion of your surgeon and anesthesiologist your surgery may need to be rescheduled.  Do not eat food after midnight the night before surgery.  No gum chewing or hard candies.  You may however, drink CLEAR liquids up to 2 hours before you are scheduled to arrive for your surgery. Do not drink anything within 2 hours of your scheduled arrival time.  Clear liquids include: - water  - apple juice without pulp - gatorade (not RED colors) - black coffee or tea (Do NOT add milk or creamers to the coffee or tea) Do NOT drink anything that is not on this list.  In addition, your doctor has ordered for you to drink the provided:  Ensure Pre-Surgery Clear Carbohydrate Drink  Drinking this carbohydrate drink up to two hours before surgery helps to reduce insulin resistance and improve patient outcomes. Please complete drinking 2 hours before scheduled arrival time.  One week prior to surgery:Stop NOW (11-15-23) Stop Anti-inflammatories (NSAIDS) such as Advil, Aleve, Ibuprofen, Motrin, Naproxen, Naprosyn and Aspirin based products such as Excedrin, Goody's Powder, BC Powder. Stop ANY OVER THE COUNTER supplements until after surgery (B Complex, Biotin, Osteo-Bi-Flex, Co Q-10, Collagen, Magnesium, Turmeric) You may still continue your Psyllium up until the day prior to surgery  You may however, continue to take Tylenol if needed for pain up until the day of surgery.  Stop Zepbound 7 days prior  to surgery-Do NOT take again until AFTER your surgery  Continue taking all of your other prescription medications up until the day of surgery.  Do NOT take any medication the day of surgery  No Alcohol for 24 hours before or after surgery.  No Smoking including e-cigarettes for 24 hours before surgery.  No chewable tobacco products for at least 6 hours before surgery.  No nicotine patches on the day of surgery.  Do not use any "recreational" drugs for at least a week (preferably 2 weeks) before your surgery.  Please be advised that the combination of cocaine and anesthesia may have negative outcomes, up to and including death. If you test positive for cocaine, your surgery will be cancelled.  On the morning of surgery brush your teeth with toothpaste and water, you may rinse your mouth with mouthwash if you wish. Do not swallow any toothpaste or mouthwash.  Use CHG Soap as directed on instruction sheet.  Do not wear jewelry, make-up, hairpins, clips or nail polish.  For welded (permanent) jewelry: bracelets, anklets, waist bands, etc.  Please have this removed prior to surgery.  If it is not removed, there is a chance that hospital personnel will need to cut it off on the day of surgery.  Do not wear lotions, powders, or perfumes.   Do not shave body hair from the neck down 48 hours before surgery.  Contact lenses, hearing aids and dentures may not be worn into surgery.  Do not bring valuables to the hospital. [facility] is not responsible for any missing/lost belongings or valuables.  Bring your C-PAP to the hospital   Notify your doctor if there is any change in your medical condition (cold, fever, infection).  Wear comfortable clothing (specific to your surgery type) to the hospital.  After surgery, you can help prevent lung complications by doing breathing exercises.  Take deep breaths and cough every 1-2 hours. Your doctor may order a device called an Incentive  Spirometer to help you take deep breaths. When coughing or sneezing, hold a pillow firmly against your incision with both hands. This is called "splinting." Doing this helps protect your incision. It also decreases belly discomfort.  If you are being admitted to the hospital overnight, leave your suitcase in the car. After surgery it may be brought to your room.  In case of increased patient census, it may be necessary for you, the patient, to continue your postoperative care in the Same Day Surgery department.  If you are being discharged the day of surgery, you will not be allowed to drive home. You will need a responsible individual to drive you home and stay with you for 24 hours after surgery.   If you are taking public transportation, you will need to have a responsible individual with you.  Please call the Pre-admissions Testing Dept. at [phone] if you have any questions about these instructions.  Surgery Visitation Policy:  Patients having surgery or a procedure may have two visitors.  Children under the age of 15 must have an adult with them who is not the patient.     Preparing for Surgery with CHLORHEXIDINE GLUCONATE (CHG) Soap  Chlorhexidine Gluconate (CHG) Soap  o An antiseptic cleaner that kills germs and bonds with the skin to continue killing germs even after washing  o Used for showering the night before surgery and morning of surgery  Before surgery, you can play an important role by reducing the number of germs on your skin.  CHG (Chlorhexidine gluconate) soap is an antiseptic cleanser which kills germs and bonds with the skin to continue killing germs even after washing.  Please do not use if you have an allergy to CHG or antibacterial soaps. If your skin becomes reddened/irritated stop using the CHG.  1. Shower the NIGHT BEFORE SURGERY and the MORNING OF SURGERY with CHG soap.  2. If you choose to wash your hair, wash your hair first as usual with your  normal shampoo.  3. After shampooing, rinse your hair and body thoroughly to remove the shampoo.  4. Use CHG as you would any other liquid soap. You can apply CHG directly to the skin and wash gently with a scrungie or a clean washcloth.  5. Apply the CHG soap to your body only from the neck down. Do not use on open wounds or open sores. Avoid contact with your eyes, ears, mouth, and genitals (private parts). Wash face and genitals (private parts) with your normal soap.  6. Wash thoroughly, paying special attention to the area where your surgery will be performed.  7. Thoroughly rinse your body with warm water.  8. Do not shower/wash with your normal soap after using and rinsing off the CHG soap.  9. Pat yourself dry with a clean towel.  10. Wear clean pajamas to bed the night before surgery.  12. Place clean sheets on your bed the night of your first shower and do not sleep with pets.  13. Shower again with the CHG soap on the day of surgery prior to arriving at the hospital.  14. Do not apply any deodorants/lotions/powders.  15. Please wear clean clothes to the hospital.  How to Use an Incentive Spirometer An incentive spirometer is a tool that measures how well you are filling your lungs with each breath. Learning to take long, deep breaths using this tool can help you keep your lungs clear and active. This may help to reverse or lessen your chance of developing breathing (pulmonary) problems, especially infection. You may be asked to use a spirometer: After a surgery. If you have a lung problem or a history of smoking. After a long period of time when you have been unable to move or be active. If the spirometer includes an indicator to show the highest number that you have reached, your health care provider or respiratory therapist will help you set a goal. Keep a log of your progress as told by your health care provider. What are the risks? Breathing too quickly may cause  dizziness or cause you to pass out. Take your time so you do not get dizzy or light-headed. If you are in pain, you may need to take pain medicine before doing incentive spirometry. It is harder to take a deep breath if you are having pain. How to use your incentive spirometer  Sit up on the edge of your bed or on a chair. Hold the incentive spirometer so that it is in an upright position. Before you use the spirometer, breathe out normally. Place the mouthpiece in your mouth. Make sure your lips are closed tightly around it. Breathe in slowly and as deeply as you can through your mouth, causing the piston or the ball to rise toward the top of the chamber. Hold your breath for 3-5 seconds, or for as long as possible. If the spirometer includes a coach indicator, use this to guide you in breathing. Slow down your breathing if the indicator goes above the marked areas. Remove the mouthpiece from your mouth and breathe out normally. The piston or ball will return to the bottom of the chamber. Rest for a few seconds, then repeat the steps 10 or more times. Take your time and take a few normal breaths between deep breaths so that you do not get dizzy or light-headed. Do this every 1-2 hours when you are awake. If the spirometer includes a goal marker to show the highest number you have reached (best effort), use this as a goal to work toward during each repetition. After each set of 10 deep breaths, cough a few times. This will help to make sure that your lungs are clear. If you have an incision on your chest or abdomen from surgery, place a pillow or a rolled-up towel firmly against the incision when you cough. This can help to reduce pain while taking deep breaths and coughing. General tips When you are able to get out of bed: Walk around often. Continue to take deep breaths and cough in order to clear your lungs. Keep using the incentive spirometer until your health care provider says it is okay  to stop using it. If you have been in the hospital, you may be told to keep using the spirometer at home. Contact a health care provider if: You are having difficulty using the spirometer. You have trouble using the spirometer as often as instructed. Your pain medicine is not giving enough relief for you to use the spirometer as told. You have a fever. Get help right away if: You develop shortness of breath. You develop  a cough with bloody mucus from the lungs. You have fluid or blood coming from an incision site after you cough. Summary An incentive spirometer is a tool that can help you learn to take long, deep breaths to keep your lungs clear and active. You may be asked to use a spirometer after a surgery, if you have a lung problem or a history of smoking, or if you have been inactive for a long period of time. Use your incentive spirometer as instructed every 1-2 hours while you are awake. If you have an incision on your chest or abdomen, place a pillow or a rolled-up towel firmly against your incision when you cough. This will help to reduce pain. Get help right away if you have shortness of breath, you cough up bloody mucus, or blood comes from your incision when you cough. This information is not intended to replace advice given to you by your health care provider. Make sure you discuss any questions you have with your health care provider. Document Revised: 04/07/2023 Document Reviewed: 04/07/2023 Elsevier Patient Education  2024 [employer].

## 2023-11-16 ENCOUNTER — Encounter
Admission: RE | Admit: 2023-11-16 | Discharge: 2023-11-16 | Disposition: A | Discharge status: Home / Self Care | Source: Ambulatory Visit | Attending: Obstetrics and Gynecology | Admitting: Obstetrics and Gynecology

## 2023-11-16 DIAGNOSIS — I10 Essential (primary) hypertension: Secondary | ICD-10-CM | POA: Insufficient documentation

## 2023-11-16 DIAGNOSIS — Z01818 Encounter for other preprocedural examination: Secondary | ICD-10-CM | POA: Diagnosis present

## 2023-11-16 DIAGNOSIS — Z01810 Encounter for preprocedural cardiovascular examination: Secondary | ICD-10-CM

## 2023-11-16 LAB — BASIC METABOLIC PANEL WITH GFR
Anion gap: 8 (ref 5–15)
BUN: 20 mg/dL (ref 8–23)
CO2: 27 mmol/L (ref 22–32)
Calcium: 9.6 mg/dL (ref 8.9–10.3)
Chloride: 94 mmol/L — ABNORMAL LOW (ref 98–111)
Creatinine, Ser: 0.59 mg/dL (ref 0.44–1.00)
GFR, Estimated: >60 mL/min (ref >60)
Glucose, Bld: 77 mg/dL (ref 70–99)
Potassium: 3.4 mmol/L — ABNORMAL LOW (ref 3.5–5.1)
Sodium: 129 mmol/L — ABNORMAL LOW (ref 135–145)

## 2023-11-16 LAB — CBC
HCT: 38.6 % (ref 36.0–46.0)
Hemoglobin: 13.3 g/dL (ref 12.0–15.0)
MCH: 30.9 pg (ref 26.0–34.0)
MCHC: 34.5 g/dL (ref 30.0–36.0)
MCV: 89.6 fL (ref 80.0–100.0)
Platelets: 408 10*3/uL — ABNORMAL HIGH (ref 150–400)
RBC: 4.31 MIL/uL (ref 3.87–5.11)
RDW: 12.6 % (ref 11.5–15.5)
WBC: 6.2 10*3/uL (ref 4.0–10.5)
nRBC: 0 % (ref 0.0–0.2)

## 2023-11-16 LAB — TYPE AND SCREEN
ABO/RH(D): O POS
Antibody Screen: NEGATIVE

## 2023-11-21 ENCOUNTER — Other Ambulatory Visit: Payer: Self-pay | Admitting: Obstetrics and Gynecology

## 2023-11-21 DIAGNOSIS — E871 Hypo-osmolality and hyponatremia: Secondary | ICD-10-CM

## 2023-11-23 ENCOUNTER — Other Ambulatory Visit: Payer: Self-pay

## 2023-11-23 ENCOUNTER — Ambulatory Visit: Payer: Self-pay | Admitting: Urgent Care

## 2023-11-23 ENCOUNTER — Ambulatory Visit
Admission: RE | Admit: 2023-11-23 | Discharge: 2023-11-23 | Disposition: A | Discharge status: Home / Self Care | Attending: Obstetrics and Gynecology | Admitting: Obstetrics and Gynecology

## 2023-11-23 ENCOUNTER — Ambulatory Visit: Admitting: Anesthesiology

## 2023-11-23 ENCOUNTER — Encounter
Admission: RE | Disposition: A | Discharge status: Home / Self Care | Payer: Self-pay | Source: Home / Self Care | Attending: Obstetrics and Gynecology

## 2023-11-23 ENCOUNTER — Encounter: Payer: Self-pay | Admitting: Obstetrics and Gynecology

## 2023-11-23 DIAGNOSIS — Z87891 Personal history of nicotine dependence: Secondary | ICD-10-CM | POA: Insufficient documentation

## 2023-11-23 DIAGNOSIS — D251 Intramural leiomyoma of uterus: Secondary | ICD-10-CM | POA: Insufficient documentation

## 2023-11-23 DIAGNOSIS — N8111 Cystocele, midline: Secondary | ICD-10-CM | POA: Diagnosis not present

## 2023-11-23 DIAGNOSIS — N816 Rectocele: Secondary | ICD-10-CM | POA: Diagnosis present

## 2023-11-23 DIAGNOSIS — I10 Essential (primary) hypertension: Secondary | ICD-10-CM | POA: Diagnosis not present

## 2023-11-23 DIAGNOSIS — N3281 Overactive bladder: Secondary | ICD-10-CM | POA: Insufficient documentation

## 2023-11-23 DIAGNOSIS — Z8249 Family history of ischemic heart disease and other diseases of the circulatory system: Secondary | ICD-10-CM | POA: Diagnosis not present

## 2023-11-23 DIAGNOSIS — N814 Uterovaginal prolapse, unspecified: Secondary | ICD-10-CM | POA: Diagnosis present

## 2023-11-23 DIAGNOSIS — N838 Other noninflammatory disorders of ovary, fallopian tube and broad ligament: Secondary | ICD-10-CM | POA: Diagnosis not present

## 2023-11-23 DIAGNOSIS — N8189 Other female genital prolapse: Secondary | ICD-10-CM | POA: Insufficient documentation

## 2023-11-23 DIAGNOSIS — Z01818 Encounter for other preprocedural examination: Secondary | ICD-10-CM

## 2023-11-23 DIAGNOSIS — N72 Inflammatory disease of cervix uteri: Secondary | ICD-10-CM | POA: Diagnosis not present

## 2023-11-23 DIAGNOSIS — N8110 Cystocele, unspecified: Secondary | ICD-10-CM | POA: Diagnosis present

## 2023-11-23 DIAGNOSIS — G4730 Sleep apnea, unspecified: Secondary | ICD-10-CM | POA: Insufficient documentation

## 2023-11-23 LAB — BASIC METABOLIC PANEL WITH GFR
Anion gap: 9 (ref 5–15)
BUN: 18 mg/dL (ref 8–23)
CO2: 24 mmol/L (ref 22–32)
Calcium: 9.1 mg/dL (ref 8.9–10.3)
Chloride: 95 mmol/L — ABNORMAL LOW (ref 98–111)
Creatinine, Ser: 0.54 mg/dL (ref 0.44–1.00)
GFR, Estimated: >60 mL/min (ref >60)
Glucose, Bld: 94 mg/dL (ref 70–99)
Potassium: 3.5 mmol/L (ref 3.5–5.1)
Sodium: 128 mmol/L — ABNORMAL LOW (ref 135–145)

## 2023-11-23 LAB — ABO/RH: ABO/RH(D): O POS

## 2023-11-23 SURGERY — COLPORRHAPHY, ANTERIOR, FOR CYSTOCELE REPAIR
Anesthesia: General | Site: Uterus

## 2023-11-23 MED ORDER — OXYCODONE HCL 5 MG PO TABS: 5.0000 mg | ORAL_TABLET | ORAL | Status: DC | PRN

## 2023-11-23 MED ORDER — GLYCOPYRROLATE 0.2 MG/ML IJ SOLN
INTRAMUSCULAR | Status: DC | PRN
  Administered 2023-11-23: .2 mg via INTRAVENOUS

## 2023-11-23 MED ORDER — MIDAZOLAM HCL 2 MG/2ML IJ SOLN
INTRAMUSCULAR | Status: AC
Start: 2023-11-23 — End: 2023-11-23
  Filled 2023-11-23: qty 2

## 2023-11-23 MED ORDER — PHENYLEPHRINE 80 MCG/ML (10ML) SYRINGE FOR IV PUSH (FOR BLOOD PRESSURE SUPPORT)
PREFILLED_SYRINGE | INTRAVENOUS | Status: DC | PRN
  Administered 2023-11-23 (×2): 80 ug via INTRAVENOUS

## 2023-11-23 MED ORDER — FENTANYL CITRATE (PF) 100 MCG/2ML IJ SOLN
25.0000 ug | INTRAMUSCULAR | Status: DC | PRN
  Administered 2023-11-23 (×2): 25 ug via INTRAVENOUS

## 2023-11-23 MED ORDER — ROCURONIUM BROMIDE 100 MG/10ML IV SOLN
INTRAVENOUS | Status: DC | PRN
Start: 2023-11-23 — End: 2023-11-23
  Administered 2023-11-23: 50 mg via INTRAVENOUS
  Administered 2023-11-23: 20 mg via INTRAVENOUS
  Administered 2023-11-23 (×2): 10 mg via INTRAVENOUS
  Administered 2023-11-23: 30 mg via INTRAVENOUS
  Administered 2023-11-23: 10 mg via INTRAVENOUS

## 2023-11-23 MED ORDER — ESTROGENS CONJUGATED 0.625 MG/GM VA CREA
TOPICAL_CREAM | VAGINAL | Status: AC
  Filled 2023-11-23: qty 30

## 2023-11-23 MED ORDER — FENTANYL CITRATE (PF) 100 MCG/2ML IJ SOLN
INTRAMUSCULAR | Status: AC
Start: 2023-11-23 — End: 2023-11-23
  Filled 2023-11-23: qty 2

## 2023-11-23 MED ORDER — PROPOFOL 1000 MG/100ML IV EMUL
INTRAVENOUS | Status: AC
  Filled 2023-11-23: qty 100

## 2023-11-23 MED ORDER — KETOROLAC TROMETHAMINE 30 MG/ML IJ SOLN
INTRAMUSCULAR | Status: DC | PRN
  Administered 2023-11-23: 15 mg via INTRAVENOUS

## 2023-11-23 MED ORDER — OXYCODONE HCL 5 MG PO TABS
ORAL_TABLET | ORAL | Status: AC
Start: 2023-11-23 — End: 2023-11-23
  Filled 2023-11-23: qty 1

## 2023-11-23 MED ORDER — LIDOCAINE HCL (CARDIAC) PF 100 MG/5ML IV SOSY
PREFILLED_SYRINGE | INTRAVENOUS | Status: DC | PRN
  Administered 2023-11-23: 100 mg via INTRAVENOUS

## 2023-11-23 MED ORDER — CEFAZOLIN SODIUM-DEXTROSE 2-4 GM/100ML-% IV SOLN
INTRAVENOUS | Status: AC
  Filled 2023-11-23: qty 100

## 2023-11-23 MED ORDER — SILVER NITRATE-POT NITRATE 75-25 % EX MISC
CUTANEOUS | Status: AC
  Filled 2023-11-23: qty 10

## 2023-11-23 MED ORDER — ONDANSETRON HCL 4 MG/2ML IJ SOLN
INTRAMUSCULAR | Status: DC | PRN
  Administered 2023-11-23: 4 mg via INTRAVENOUS

## 2023-11-23 MED ORDER — SODIUM CHLORIDE 0.9 % IV SOLN: INTRAVENOUS | Status: DC | PRN

## 2023-11-23 MED ORDER — SUGAMMADEX SODIUM 200 MG/2ML IV SOLN
INTRAVENOUS | Status: DC | PRN
  Administered 2023-11-23: 200 mg via INTRAVENOUS

## 2023-11-23 MED ORDER — PHENYLEPHRINE 80 MCG/ML (10ML) SYRINGE FOR IV PUSH (FOR BLOOD PRESSURE SUPPORT)
PREFILLED_SYRINGE | INTRAVENOUS | Status: AC
  Filled 2023-11-23: qty 10

## 2023-11-23 MED ORDER — OXYCODONE HCL 5 MG/5ML PO SOLN: 5.0000 mg | Freq: Once | ORAL | Status: AC | PRN

## 2023-11-23 MED ORDER — 0.9 % SODIUM CHLORIDE (POUR BTL) OPTIME
TOPICAL | Status: DC | PRN
  Administered 2023-11-23: 500 mL

## 2023-11-23 MED ORDER — ROCURONIUM BROMIDE 10 MG/ML (PF) SYRINGE
PREFILLED_SYRINGE | INTRAVENOUS | Status: AC
  Filled 2023-11-23: qty 10

## 2023-11-23 MED ORDER — ONDANSETRON HCL 4 MG/2ML IJ SOLN
INTRAMUSCULAR | Status: AC
  Filled 2023-11-23: qty 2

## 2023-11-23 MED ORDER — ONDANSETRON 4 MG PO TBDP: 4.0000 mg | ORAL_TABLET | Freq: Four times a day (QID) | ORAL | Status: DC | PRN

## 2023-11-23 MED ORDER — OXYCODONE HCL 5 MG PO TABS
5.0000 mg | ORAL_TABLET | Freq: Once | ORAL | Status: AC | PRN
  Administered 2023-11-23: 5 mg via ORAL

## 2023-11-23 MED ORDER — SODIUM CHLORIDE 0.9% FLUSH: 3.0000 mL | Freq: Two times a day (BID) | INTRAVENOUS | Status: DC

## 2023-11-23 MED ORDER — LACTATED RINGERS IV SOLN: INTRAVENOUS | Status: DC

## 2023-11-23 MED ORDER — POVIDONE-IODINE 10 % EX SWAB
2.0000 | Freq: Once | CUTANEOUS | Status: AC
  Administered 2023-11-23: 2 via TOPICAL

## 2023-11-23 MED ORDER — LIDOCAINE HCL (PF) 2 % IJ SOLN
INTRAMUSCULAR | Status: AC
  Filled 2023-11-23: qty 5

## 2023-11-23 MED ORDER — LIDOCAINE-EPINEPHRINE 1 %-1:100000 IJ SOLN
INTRAMUSCULAR | Status: DC | PRN
  Administered 2023-11-23: 25 mL

## 2023-11-23 MED ORDER — PROPOFOL 10 MG/ML IV BOLUS
INTRAVENOUS | Status: DC | PRN
  Administered 2023-11-23: 150 mg via INTRAVENOUS
  Administered 2023-11-23: 150 ug/kg/min via INTRAVENOUS

## 2023-11-23 MED ORDER — LIDOCAINE-EPINEPHRINE 1 %-1:100000 IJ SOLN
INTRAMUSCULAR | Status: AC
  Filled 2023-11-23: qty 1

## 2023-11-23 MED ORDER — DEXAMETHASONE SODIUM PHOSPHATE 10 MG/ML IJ SOLN
INTRAMUSCULAR | Status: AC
  Filled 2023-11-23: qty 1

## 2023-11-23 MED ORDER — ORAL CARE MOUTH RINSE: 15.0000 mL | Freq: Once | OROMUCOSAL | Status: AC

## 2023-11-23 MED ORDER — KETOROLAC TROMETHAMINE 30 MG/ML IJ SOLN
INTRAMUSCULAR | Status: AC
  Filled 2023-11-23: qty 1

## 2023-11-23 MED ORDER — SODIUM CHLORIDE 0.9% FLUSH: 3.0000 mL | INTRAVENOUS | Status: DC | PRN

## 2023-11-23 MED ORDER — ACETAMINOPHEN 500 MG PO TABS
1000.0000 mg | ORAL_TABLET | ORAL | Status: AC
  Administered 2023-11-23: 1000 mg via ORAL

## 2023-11-23 MED ORDER — CEFAZOLIN SODIUM-DEXTROSE 2-4 GM/100ML-% IV SOLN
2.0000 g | Freq: Once | INTRAVENOUS | Status: AC
  Administered 2023-11-23: 2 g via INTRAVENOUS

## 2023-11-23 MED ORDER — CHLORHEXIDINE GLUCONATE 0.12 % MT SOLN
OROMUCOSAL | Status: AC
  Filled 2023-11-23: qty 15

## 2023-11-23 MED ORDER — GABAPENTIN 300 MG PO CAPS
ORAL_CAPSULE | ORAL | Status: AC
  Filled 2023-11-23: qty 1

## 2023-11-23 MED ORDER — GLYCOPYRROLATE 0.2 MG/ML IJ SOLN
INTRAMUSCULAR | Status: AC
  Filled 2023-11-23: qty 1

## 2023-11-23 MED ORDER — CHLORHEXIDINE GLUCONATE 0.12 % MT SOLN
15.0000 mL | Freq: Once | OROMUCOSAL | Status: AC
Start: 2023-11-23 — End: 2023-11-23
  Administered 2023-11-23: 15 mL via OROMUCOSAL

## 2023-11-23 MED ORDER — GABAPENTIN 300 MG PO CAPS
300.0000 mg | ORAL_CAPSULE | ORAL | Status: AC
  Administered 2023-11-23: 300 mg via ORAL

## 2023-11-23 MED ORDER — PROPOFOL 10 MG/ML IV BOLUS
INTRAVENOUS | Status: AC
  Filled 2023-11-23: qty 20

## 2023-11-23 MED ORDER — FENTANYL CITRATE (PF) 100 MCG/2ML IJ SOLN
INTRAMUSCULAR | Status: DC | PRN
  Administered 2023-11-23: 50 ug via INTRAVENOUS
  Administered 2023-11-23: 25 ug via INTRAVENOUS
  Administered 2023-11-23: 50 ug via INTRAVENOUS

## 2023-11-23 MED ORDER — MIDAZOLAM HCL 2 MG/2ML IJ SOLN
INTRAMUSCULAR | Status: DC | PRN
  Administered 2023-11-23: 2 mg via INTRAVENOUS

## 2023-11-23 MED ORDER — ACETAMINOPHEN 500 MG PO TABS
ORAL_TABLET | ORAL | Status: AC
  Filled 2023-11-23: qty 2

## 2023-11-23 MED ORDER — DEXAMETHASONE SODIUM PHOSPHATE 10 MG/ML IJ SOLN
INTRAMUSCULAR | Status: DC | PRN
  Administered 2023-11-23: 10 mg via INTRAVENOUS

## 2023-11-23 SURGICAL SUPPLY — 40 items
BAG URINE DRAIN 2000ML AR STRL (UROLOGICAL SUPPLIES) ×4 IMPLANT
BLADE SURG 15 STRL LF DISP TIS (BLADE) ×4 IMPLANT
CATH ROBINSON RED A/P 16FR (CATHETERS) ×3 IMPLANT
CATH URTH 16FR FL 2W BLN LF (CATHETERS) ×4 IMPLANT
DRAPE PERI LITHO V/GYN (MISCELLANEOUS) ×4 IMPLANT
DRAPE SURG 17X11 SM STRL (DRAPES) ×4 IMPLANT
DRAPE UNDER BUTTOCK W/FLU (DRAPES) ×4 IMPLANT
ELECTRODE REM PT RTRN 9FT ADLT (ELECTROSURGICAL) ×4 IMPLANT
GAUZE 4X4 16PLY ~~LOC~~+RFID DBL (SPONGE) ×8 IMPLANT
GAUZE PACK 2X3YD (PACKING) IMPLANT
GLOVE SURG SYN 6.5 ES PF (GLOVE) ×8 IMPLANT
GLOVE SURG SYN 6.5 PF PI (GLOVE) ×8 IMPLANT
GLOVE SURG SYN 8.0 (GLOVE) ×4 IMPLANT
GLOVE SURG SYN 8.0 PF PI (GLOVE) ×4 IMPLANT
GOWN STRL REUS W/ TWL LRG LVL3 (GOWN DISPOSABLE) ×12 IMPLANT
GOWN STRL REUS W/ TWL XL LVL3 (GOWN DISPOSABLE) ×4 IMPLANT
KIT TURNOVER CYSTO (KITS) ×4 IMPLANT
LABEL OR SOLS (LABEL) ×4 IMPLANT
MANIFOLD NEPTUNE II (INSTRUMENTS) ×4 IMPLANT
NDL HYPO 22X1.5 SAFETY MO (MISCELLANEOUS) ×3 IMPLANT
NEEDLE HYPO 22X1.5 SAFETY MO (MISCELLANEOUS) ×4 IMPLANT
NS IRRIG 1000ML POUR BTL (IV SOLUTION) ×3 IMPLANT
NS IRRIG 500ML POUR BTL (IV SOLUTION) ×4 IMPLANT
PACK BASIN MINOR ARMC (MISCELLANEOUS) ×4 IMPLANT
PAD OB MATERNITY 11 LF (PERSONAL CARE ITEMS) ×4 IMPLANT
PAD PREP OB/GYN DISP 24X41 (PERSONAL CARE ITEMS) ×4 IMPLANT
SCRUB CHG 4% DYNA-HEX 4OZ (MISCELLANEOUS) ×3 IMPLANT
SOLUTION PREP PVP 2OZ (MISCELLANEOUS) ×9 IMPLANT
SURGILUBE 2OZ TUBE FLIPTOP (MISCELLANEOUS) ×4 IMPLANT
SUT PDS 2-0 27IN (SUTURE) ×4 IMPLANT
SUT VIC AB 0 CT1 27XCR 8 STRN (SUTURE) ×10 IMPLANT
SUT VIC AB 0 CT1 36 (SUTURE) ×7 IMPLANT
SUT VIC AB 2-0 CT1 36 (SUTURE) ×2 IMPLANT
SUT VIC AB 2-0 SH 27XBRD (SUTURE) ×13 IMPLANT
SUT VIC AB 3-0 SH 27X BRD (SUTURE) ×1 IMPLANT
SYR 10ML LL (SYRINGE) ×4 IMPLANT
SYR CONTROL 10ML LL (SYRINGE) ×4 IMPLANT
TRAP FLUID SMOKE EVACUATOR (MISCELLANEOUS) ×4 IMPLANT
WATER STERILE IRR 1000ML POUR (IV SOLUTION) ×3 IMPLANT
WATER STERILE IRR 500ML POUR (IV SOLUTION) ×3 IMPLANT

## 2023-11-23 NOTE — Anesthesia Postprocedure Evaluation (Signed)
 Anesthesia Post Note  Patient: Caitlin Romero  Procedure(s) Performed: COLPORRHAPHY, ANTERIOR, FOR CYSTOCELE REPAIR COLPORRHAPHY, POSTERIOR, FOR RECTOCELE REPAIR HYSTERECTOMY, VAGINAL, WITH SALPINGO-OOPHORECTOMY (Uterus) PERINEOPLASTY (Perineum)  Patient location during evaluation: PACU Anesthesia Type: General Level of consciousness: awake and alert Pain management: pain level controlled Vital Signs Assessment: post-procedure vital signs reviewed and stable Respiratory status: spontaneous breathing, nonlabored ventilation, respiratory function stable and patient connected to nasal cannula oxygen Cardiovascular status: blood pressure returned to baseline and stable Postop Assessment: no apparent nausea or vomiting Anesthetic complications: no  No notable events documented.   Last Vitals:  Vitals:   11/23/23 1115 11/23/23 1145  BP: 128/74 (!) 104/47  Pulse: (!) 53 63  Resp: 11 16  Temp:  (!) 36.3 C  SpO2: 96% 100%    Last Pain:  Vitals:   11/23/23 1158  TempSrc:   PainSc: 6                  Enrique Harvest

## 2023-11-23 NOTE — Brief Op Note (Signed)
 11/23/2023  10:41 AM  PATIENT:  Caitlin Romero  68 y.o. female  PRE-OPERATIVE DIAGNOSIS:  pelvic organ prolapse cystocele rectocele uterine descensus  POST-OPERATIVE DIAGNOSIS:  s/p pelvic organ prolapse cystocelerectoceleuterine descensus  PROCEDURE:  Procedure(s): COLPORRHAPHY, ANTERIOR, FOR CYSTOCELE REPAIR (N/A) COLPORRHAPHY, POSTERIOR, FOR RECTOCELE REPAIR (N/A) HYSTERECTOMY, VAGINAL, WITH SALPINGO-OOPHORECTOMY PERINEOPLASTY TVH / BSO  anterior / posterior colporrhaphy , perineoplasty   SURGEON:  Surgeons and Role:    * Ameir Faria, Joselyn Nicely, MD - Primary    * Mithra Spano, Kasey Painter, MD - Assisting  PHYSICIAN ASSISTANT: PA student Arabella Beach   ASSISTANTS: none   ANESTHESIA:   general  EBL:  50 mL , iof 1200 cc, uo 225cc  BLOOD ADMINISTERED:none  DRAINS: none   LOCAL MEDICATIONS USED:  LIDOCAINE   SPECIMEN:  Source of Specimen:  cx , utx , bilateral fallopian tubes and ovaries   DISPOSITION OF SPECIMEN:  PATHOLOGY  COUNTS:  YES  TOURNIQUET:  * No tourniquets in log *  DICTATION: .Other Dictation: Dictation Number verbal  PLAN OF CARE: Discharge to home after PACU  PATIENT DISPOSITION:  PACU - hemodynamically stable.   Delay start of Pharmacological VTE agent (>24hrs) due to surgical blood loss or risk of bleeding: not applicable

## 2023-11-23 NOTE — Anesthesia Procedure Notes (Addendum)
 Procedure Name: Intubation Date/Time: 11/23/2023 7:52 AM  Performed by: Lourena Royal, RNPre-anesthesia Checklist: Patient identified, Emergency Drugs available, Suction available and Patient being monitored Patient Re-evaluated:Patient Re-evaluated prior to induction Oxygen Delivery Method: Circle system utilized Preoxygenation: Pre-oxygenation with 100% oxygen Induction Type: IV induction Ventilation: Mask ventilation without difficulty Laryngoscope Size: McGrath and 3 Grade View: Grade I Tube type: Oral Tube size: 7.0 mm Number of attempts: 1 Airway Equipment and Method: Stylet and Video-laryngoscopy Placement Confirmation: ETT inserted through vocal cords under direct vision, positive ETCO2 and breath sounds checked- equal and bilateral Secured at: 21 cm Tube secured with: Tape Dental Injury: Teeth and Oropharynx as per pre-operative assessment

## 2023-11-23 NOTE — Anesthesia Preprocedure Evaluation (Signed)
 Anesthesia Evaluation  Patient identified by MRN, date of birth, ID band Patient awake    Reviewed: Allergy & Precautions, NPO status , Patient's Chart, lab work & pertinent test results  Airway Mallampati: III  TM Distance: <3 FB Neck ROM: full    Dental  (+) Chipped   Pulmonary neg shortness of breath, sleep apnea , former smoker   Pulmonary exam normal        Cardiovascular hypertension, (-) angina negative cardio ROS Normal cardiovascular exam     Neuro/Psych negative neurological ROS  negative psych ROS   GI/Hepatic negative GI ROS, Neg liver ROS,neg GERD  ,,  Endo/Other  negative endocrine ROS    Renal/GU      Musculoskeletal   Abdominal   Peds  Hematology negative hematology ROS (+)   Anesthesia Other Findings Past Medical History: No date: BRCA negative     Comment:  brca1/2 neg No date: Cystocele No date: Hypertension No date: Increased BMI No date: OAB (overactive bladder) No date: OSA on CPAP No date: Rectocele No date: Urge incontinence No date: Vaginal atrophy  Past Surgical History: No date: COLONOSCOPY No date: TUBAL LIGATION  BMI    Body Mass Index: 31.57 kg/m      Reproductive/Obstetrics negative OB ROS                              Anesthesia Physical Anesthesia Plan  ASA: 3  Anesthesia Plan: General ETT   Post-op Pain Management:    Induction: Intravenous  PONV Risk Score and Plan: Ondansetron, Dexamethasone, Midazolam and Treatment may vary due to age or medical condition  Airway Management Planned: Oral ETT  Additional Equipment:   Intra-op Plan:   Post-operative Plan: Extubation in OR  Informed Consent: I have reviewed the patients History and Physical, chart, labs and discussed the procedure including the risks, benefits and alternatives for the proposed anesthesia with the patient or authorized representative who has indicated his/her  understanding and acceptance.     Dental Advisory Given  Plan Discussed with: Anesthesiologist, CRNA and Surgeon  Anesthesia Plan Comments: (Patient consented for risks of anesthesia including but not limited to:  - adverse reactions to medications - damage to eyes, teeth, lips or other oral mucosa - nerve damage due to positioning  - sore throat or hoarseness - Damage to heart, brain, nerves, lungs, other parts of body or loss of life  Patient voiced understanding and assent.)         Anesthesia Quick Evaluation

## 2023-11-23 NOTE — Progress Notes (Signed)
 Pt ready for TVH and bso and A+P repair Na+ 131- repeat pending . All questions answered . Proceed

## 2023-11-23 NOTE — Transfer of Care (Signed)
 Immediate Anesthesia Transfer of Care Note  Patient: Caitlin Romero  Procedure(s) Performed: COLPORRHAPHY, ANTERIOR, FOR CYSTOCELE REPAIR COLPORRHAPHY, POSTERIOR, FOR RECTOCELE REPAIR HYSTERECTOMY, VAGINAL, WITH SALPINGO-OOPHORECTOMY (Uterus) PERINEOPLASTY (Perineum)  Patient Location: PACU  Anesthesia Type:General  Level of Consciousness: awake and drowsy  Airway & Oxygen Therapy: Patient Spontanous Breathing and Patient connected to face mask oxygen  Post-op Assessment: Report given to RN, Post -op Vital signs reviewed and stable, and Patient moving all extremities X 4  Post vital signs: Reviewed and stable  Last Vitals:  Vitals Value Taken Time  BP 88/70 11/23/23 10:45  Temp    Pulse 58 11/23/23 10:48  Resp 13 11/23/23 10:48  SpO2 96 % 11/23/23 10:48  Vitals shown include unfiled device data.  Last Pain:  Vitals:   11/23/23 0614  TempSrc: Temporal  PainSc: 0-No pain         Complications: No notable events documented.

## 2023-11-24 ENCOUNTER — Encounter: Payer: Self-pay | Admitting: Obstetrics and Gynecology

## 2023-11-24 LAB — SURGICAL PATHOLOGY

## 2023-11-24 NOTE — Op Note (Unsigned)
 NAME: Caitlin Romero, Caitlin Romero MEDICAL RECORD NO: [id number] ACCOUNT NO: [id number] DATE OF BIRTH: 1956-01-19 FACILITY: [facility] LOCATION: [facility] PHYSICIAN: Carolynn Citrin, MD  Operative Report   DATE OF PROCEDURE: 11/23/2023  PREOPERATIVE DIAGNOSES:  Pelvic organ prolapse with grade 3 cystocele, grade 2 uterine descensus, and grade 2-3 rectocele.  POSTOPERATIVE DIAGNOSES:  Pelvic organ prolapse with grade 3 cystocele, grade 2 uterine descensus, and grade 2-3 rectocele.  PROCEDURE: 1.  Total vaginal hysterectomy. 2.  Bilateral salpingo-oophorectomy. 3.  Anterior and posterior colporrhaphy. 4.  Perineoplasty.  SURGEON:  Carolynn Citrin, MD  FIRST ASSISTANT:  Everlena Hoard, MD  SECOND ASSISTANT:  PA student, Arabella [place].  ANESTHESIA:  General endotracheal anesthesia  INDICATIONS:  This is a 68 year old gravida 2, para 2 patient with a long history of pelvic organ prolapse and failing conservative treatment with pessary use.  The patient has elected for definitive surgery.  DESCRIPTION OF PROCEDURE:  After adequate general endotracheal anesthesia, the patient was placed in the dorsal supine position with the legs in the [place] stirrups.  The patient's lower abdomen, perineum, and vagina were prepped and draped in normal  sterile fashion.  The patient did receive 2 g of IV Ancef  for surgical prophylaxis.  Timeout was performed.  A weighted speculum was placed in the posterior vaginal vault after draining the bladder, yielding 200 mL of concentrated urine.  Two thyroid  tenacula were placed on the cervix and the cervix was circumferentially injected with 1% lidocaine  with 1:100,000 epinephrine .  A direct posterior colpotomy incision was made followed by circumferentially incising the anterior cervix.  The cardinal  ligaments were bilaterally clamped, transected, and suture ligated with 0 Vicryl suture.  The anterior cul-de-sac was entered and the Deaver retractor was placed  within and the uterine arteries were bilaterally clamped, transected, and suture ligated  with 0 Vicryl suture.  Cornual clamping occurred bilaterally with delivery of the cervix and the uterus.  Each of these pedicles was doubly ligated.  Each ovary was identified with the fallopian tube and the infundibulopelvic ligament were bilaterally  clamped and each ovary and fallopian tubes were removed sharply.  These pedicles were doubly ligated with 0 Vicryl suture.  Good hemostasis noted.  The peritoneum was then closed in a pursestring fashion with 2-0 PDS.  Attention was then directed to the  anterior vagina where a grade 3 cystocele was noted.  The central midline portion of the cystocele was placed on traction and the subepithelial tissues were infused with 1% lidocaine  with epinephrine .  The vaginal epithelium was incised centrally and  mosquito clamps were used to hold and retract the tissues laterally.  The dissection occurred all the way to 1.5 cm inferior to the urethral meatus.  The endopelvic fascia was then dissected off the vaginal epithelium with sharp and blunt dissection.   Once healthy endopelvic fascia was identified and reduction of the cystocele, horizontal mattress sutures were used to incorporate healthier tissue, plicated centrally.  Five separate sutures were used to reduce the cystocele.  The vaginal epithelium was  then excised.  Attention was directed to the patient's posterior vaginal vault where a grade 2-3 rectocele was identified.  The perineal body was then incised after injecting with 1% lidocaine  with epinephrine  in a triangle fashion and the vaginal  epithelium was placed on traction and the subepithelial tissues were injected with 1% lidocaine  with epinephrine .  The central portion of the posterior vaginal wall was opened and mosquito clamps were used to hold the tissues laterally.  Likewise, the  endopelvic fascia was rolled and sharply dissected off of the posterior vaginal  epithelium.  The defect was then closed with interrupted 2-0 Vicryl sutures, thrown in a mattress suture fashion.  Several sutures were used to close the defect.  The vaginal  epithelium was then excised.  The perineal body was then reapproximated, finishing with the perineoplasty with a crown suture and closing of the skin with a subcuticular 3-0 Vicryl suture.  The anterior vaginal epithelium, the vaginal cuff from the  hysterectomy, and the posterior vaginal epithelium were closed with running 0 Vicryl suture.  Good approximation of tissues.  Good cosmetic effect at the end of the procedure.  The bladder was then re-catheterized, yielding an additional 25 mL clear  urine.  There was no active bleeding at the end of the procedure.  The patient tolerated the procedure well.  ESTIMATED BLOOD LOSS:  50 mL  INTRAOPERATIVE FLUIDS:  1200 mL  URINE OUTPUT:  225 mL  DISPOSITION:  The patient was taken to the recovery room in good condition.   SHW D: 11/23/2023 5:40:20 pm T: 11/24/2023 1:45:00 am  JOB: [id number]/ [id number]

## 2024-02-21 ENCOUNTER — Ambulatory Visit (INDEPENDENT_AMBULATORY_CARE_PROVIDER_SITE_OTHER): Admitting: Vascular Surgery

## 2024-02-21 ENCOUNTER — Encounter (INDEPENDENT_AMBULATORY_CARE_PROVIDER_SITE_OTHER): Payer: Self-pay | Admitting: Vascular Surgery

## 2024-02-21 VITALS — BP 103/69 | HR 62 | Ht 62.0 in | Wt 152.4 lb

## 2024-02-21 DIAGNOSIS — I10 Essential (primary) hypertension: Secondary | ICD-10-CM | POA: Diagnosis not present

## 2024-02-21 DIAGNOSIS — I890 Lymphedema, not elsewhere classified: Secondary | ICD-10-CM

## 2024-02-21 NOTE — Progress Notes (Signed)
 Subjective:    Patient ID: Caitlin Romero, female    DOB: 1955/10/31, 68 y.o.   MRN: [id number] Chief Complaint  Patient presents with   New Patient (Initial Visit)    Caitlin Romero is a 68 yo female who presents to clinic today with chief complaint of bilateral extremity ankle and foot swelling.  Patient states that she has started to develop consistent bilateral lower extremity ankle and foot swelling with numbness and tingling to bilateral toes left greater than right.  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  This is continued and she was placed on pregabalin to help with the nerve pain.  She endorses that the pregabalin helps but every once in a while her toes get highly irritated.  She states that in discussing this with her PCP she should have her vascular system checked.  Today on exam she has very little swelling to her ankles and her feet.  She endorses wearing compression most of the time.  Her compression is 15-20 rather than 20-30.  I suggested that she change to 20-30 to see if she can get more consistent compression to her lower extremities.  She does not have any severe swelling, discoloration to her feet or toes, or any noted blood pooling.  She denies any history of DVTs to her lower extremities.  She denies any previous trauma or injuries or surgery to her feet or ankles.    Review of Systems  Constitutional: Negative.   Cardiovascular:  Positive for leg swelling.  Neurological:  Positive for numbness.       Numbness and tingling to bilateral toes  All other systems reviewed and are negative.      Objective:   Physical Exam Vitals reviewed.  Constitutional:      Appearance: Normal appearance. She is normal weight.  HENT:     Head: Normocephalic and atraumatic.  Eyes:     Pupils: Pupils are equal,  round, and reactive to light.  Cardiovascular:     Rate and Rhythm: Normal rate and regular rhythm.     Pulses: Normal pulses.     Heart sounds: Normal heart sounds.  Pulmonary:     Effort: Pulmonary effort is normal.     Breath sounds: Normal breath sounds.  Abdominal:     General: Abdomen is flat. Bowel sounds are normal.     Palpations: Abdomen is soft.  Musculoskeletal:     Right lower leg: Edema present.     Left lower leg: Edema present.  Skin:    General: Skin is warm and dry.     Capillary Refill: Capillary refill takes 2 to 3 seconds.  Neurological:     General: No focal deficit present.     Mental Status: She is alert and oriented to person, place, and time. Mental status is at baseline.  Psychiatric:        Mood and Affect: Mood normal.        Behavior: Behavior normal.        Thought Content: Thought content normal.        Judgment: Judgment normal.     BP 103/69   Pulse 62   Ht 5' 2 (1.575 m)   Wt 152 lb 6.4 oz (69.1 kg)   BMI 27.87 kg/m   Past Medical History:  Diagnosis Date  BRCA negative    brca1/2 neg   Cystocele    Hypertension    Increased BMI    OAB (overactive bladder)    OSA on CPAP    Rectocele    Urge incontinence    Vaginal atrophy     Social History   Socioeconomic History   Marital status: Divorced    Spouse name: Not on file   Number of children: Not on file   Years of education: Not on file   Highest education level: Not on file  Occupational History   Not on file  Tobacco Use   Smoking status: Former    Types: Cigarettes   Smokeless tobacco: Never  Vaping Use   Vaping status: Never Used  Substance and Sexual Activity   Alcohol use: Yes    Comment: occas   Drug use: No   Sexual activity: Not Currently    Birth control/protection: Surgical  Other Topics Concern   Not on file  Social History Narrative   Not on file   Social Drivers of Health   Financial Resource Strain: Low Risk  (01/19/2024)   Received from  [facility]   Overall Financial Resource Strain (CARDIA)    Difficulty of Paying Living Expenses: Not hard at all  Food Insecurity: No Food Insecurity (01/19/2024)   Received from [facility]   Hunger Vital Sign    Within the past 12 months, you worried that your food would run out before you got the money to buy more.: Never true    Within the past 12 months, the food you bought just didn't last and you didn't have money to get more.: Never true  Transportation Needs: No Transportation Needs (01/19/2024)   Received from [facility]    In the past 12 months, has lack of transportation kept you from medical appointments or from getting medications?: No    Lack of Transportation (Non-Medical): No  Physical Activity: Not on file  Stress: Not on file  Social Connections: Not on file  Intimate Partner Violence: Not on file    Past Surgical History:  Procedure Laterality Date   COLONOSCOPY     CYSTOCELE REPAIR N/A 11/23/2023   Procedure: COLPORRHAPHY, ANTERIOR, FOR CYSTOCELE REPAIR;  Surgeon: Schermerhorn, Debby PARAS, MD;  Location: [facility];  Service: Gynecology;  Laterality: N/A;   HYSTERECTOMY, VAGINAL, WITH SALPINGO-OOPHORECTOMY  11/23/2023   Procedure: HYSTERECTOMY, VAGINAL, WITH SALPINGO-OOPHORECTOMY;  Surgeon: Lovetta Debby PARAS, MD;  Location: [facility];  Service: Gynecology;;   PERINEOPLASTY  11/23/2023   Procedure: PERINEOPLASTY;  Surgeon: Lovetta Debby PARAS, MD;  Location: [facility];  Service: Gynecology;;   RECTOCELE REPAIR N/A 11/23/2023   Procedure: COLPORRHAPHY, POSTERIOR, FOR RECTOCELE REPAIR;  Surgeon: Lovetta Debby PARAS, MD;  Location: [facility];  Service: Gynecology;  Laterality: N/A;   TUBAL LIGATION      Family History  Problem Relation Age of Onset   Diabetes Father    Colon polyps Father    Breast cancer Maternal Grandmother    Ovarian cancer Neg Hx    Colon cancer Neg Hx    Heart  disease Neg Hx     No Known Allergies     Latest Ref Rng & Units 11/16/2023    2:18 PM 05/23/2013    8:58 AM  CBC  WBC 4.0 - 10.5 K/uL 6.2  4.3   Hemoglobin 12.0 - 15.0 g/dL 86.6  85.7   Hematocrit 36.0 -  46.0 % 38.6  41.0   Platelets 150 - 400 K/uL 408        CMP     Component Value Date/Time   NA 128 (L) 11/23/2023 0739   NA 139 05/23/2013 0858   K 3.5 11/23/2023 0739   CL 95 (L) 11/23/2023 0739   CO2 24 11/23/2023 0739   GLUCOSE 94 11/23/2023 0739   BUN 18 11/23/2023 0739   BUN 8 05/23/2013 0858   CREATININE 0.54 11/23/2023 0739   CALCIUM 9.1 11/23/2023 0739   PROT 6.9 05/23/2013 0858   ALBUMIN 4.6 05/23/2013 0858   AST 18 05/23/2013 0858   ALT 11 05/23/2013 0858   ALKPHOS 61 05/23/2013 0858   BILITOT 0.3 05/23/2013 0858   GFRNONAA >60 11/23/2023 0739     No results found.     Assessment & Plan:   1. Lymphedema (Primary) Recommend:  I have had a long discussion with the patient regarding swelling and why it  causes symptoms.  Patient will begin wearing graduated compression on a daily basis a prescription was given. The patient will  wear the stockings first thing in the morning and removing them in the evening. The patient is instructed specifically not to sleep in the stockings.  Patient's prior socks/stockings compression were 15-20.  I asked that she wear 20-30 compression to see if this helps reduce some of the foot and ankle swelling.  In addition, behavioral modification will be initiated.  This will include frequent elevation, use of over the counter pain medications and exercise such as walking.  Patient was also instructed that pregabalin can create some swelling in her ankles and feet and although she needs a medication for her nerve pain for compression to help resolve this.  In the long-term if she can get off the pregabalin medication this may actually help resolve some swelling to her feet and ankles.  Consideration for a lymph pump will also be  made based upon the effectiveness of conservative therapy.  This would help to improve the edema control and prevent sequela such as ulcers and infections   The patient will follow-up with me after the foot surgery at her convenience   2. Hypertension, unspecified type Continue antihypertensive medications as already ordered, these medications have been reviewed and there are no changes at this time.   Current Outpatient Medications on File Prior to Visit  Medication Sig Dispense Refill   acetaminophen  (TYLENOL ) 500 MG tablet Take 1,000 mg by mouth every 6 (six) hours as needed for moderate pain (pain score 4-6).     b complex vitamins capsule Take 1 capsule by mouth daily.     Biotin 89999 MCG TABS Take 1 tablet by mouth daily.     Boswellia-Glucosamine-Vit D (OSTEO BI-FLEX ONE PER DAY PO) Take 1 tablet by mouth daily.     Coenzyme Q10 (CO Q-10) 400 MG CAPS Take 400 mg by mouth daily.     Collagen-Vitamin C-Biotin (COLLAGEN PO) Take 1 Scoop by mouth daily.     furosemide (LASIX) 20 MG tablet Take 40 mg by mouth.     gabapentin  (NEURONTIN ) 300 MG capsule Take 300 mg by mouth at bedtime.     lisinopril (ZESTRIL) 10 MG tablet Take 10 mg by mouth daily.     MAGNESIUM CITRATE PO Take 250 mg by mouth daily.     polyethylene glycol powder (GLYCOLAX/MIRALAX) 17 GM/SCOOP powder Take 17 g by mouth daily. Dissolve 1 capful (17g) in 4-8 ounces of liquid and  take by mouth daily.     Turmeric 500 MG TABS Take 500 mg by mouth daily.     ZEPBOUND 10 MG/0.5ML Pen Inject 10 mg into the skin once a week. Tuesdays     estradiol (ESTRACE) 0.1 MG/GM vaginal cream Place 1 Applicatorful vaginally every other day. (Patient not taking: Reported on 02/21/2024)     lisinopril-hydrochlorothiazide (ZESTORETIC) 10-12.5 MG tablet Take 1 tablet by mouth every morning. (Patient not taking: Reported on 02/21/2024)     psyllium (REGULOID) 0.52 g capsule Take 2.6 g by mouth in the morning, at noon, and at bedtime. (Patient not  taking: Reported on 02/21/2024)     No current facility-administered medications on file prior to visit.    There are no Patient Instructions on file for this visit. No follow-ups on file.   Gwendlyn JONELLE Shank, NP

## 2024-02-29 ENCOUNTER — Other Ambulatory Visit: Payer: Self-pay | Admitting: Podiatry

## 2024-03-15 ENCOUNTER — Encounter: Payer: Self-pay | Admitting: Podiatry

## 2024-03-15 NOTE — Anesthesia Preprocedure Evaluation (Addendum)
 Anesthesia Evaluation  Patient identified by MRN, date of birth, ID band Patient awake    Reviewed: Allergy & Precautions, H&P , NPO status , Patient's Chart, lab work & pertinent test results  Airway Mallampati: III  TM Distance: <3 FB Neck ROM: Full   Comment: 1 FB TMD, would expect difficult airway if intubation needed  Dental no notable dental hx.    Pulmonary neg pulmonary ROS, sleep apnea , former smoker   Pulmonary exam normal breath sounds clear to auscultation       Cardiovascular hypertension, negative cardio ROS Normal cardiovascular exam Rhythm:Regular Rate:Normal     Neuro/Psych negative neurological ROS  negative psych ROS   GI/Hepatic negative GI ROS, Neg liver ROS,,,  Endo/Other  negative endocrine ROS    Renal/GU negative Renal ROS  negative genitourinary   Musculoskeletal negative musculoskeletal ROS (+) Arthritis ,  02-21-24 office note wearing graduated compression on a daily basis a prescription was given. The patient will  wear the stockings first thing in the morning and removing them in the evening. The patient is instructed specifically not to sleep in the stockings.  Patient's prior socks/stockings compression were 15-20.  I asked that she wear 20-30 compression to see if this helps reduce some of the foot and ankle swelling.   Abdominal   Peds negative pediatric ROS (+)  Hematology negative hematology ROS (+)   Anesthesia Other Findings Medical History Left side meralgia paresthetica BRCA negative  Increased BMI Rectocele  Vaginal atrophy Urge incontinence  Cystocele OAB (overactive bladder)  Hypertension OSA on CPAP  Arthritis Lymphedema of both lower extremities--wears compression stockings  At risk for difficult intubation on preop assessment    Reproductive/Obstetrics negative OB ROS                              Anesthesia Physical Anesthesia  Plan  ASA: 3  Anesthesia Plan: General   Post-op Pain Management:    Induction: Intravenous  PONV Risk Score and Plan:   Airway Management Planned: Natural Airway and Nasal Cannula  Additional Equipment:   Intra-op Plan:   Post-operative Plan: Extubation in OR  Informed Consent: I have reviewed the patients History and Physical, chart, labs and discussed the procedure including the risks, benefits and alternatives for the proposed anesthesia with the patient or authorized representative who has indicated his/her understanding and acceptance.     Dental Advisory Given  Plan Discussed with: Anesthesiologist, CRNA and Surgeon  Anesthesia Plan Comments: (Patient consented for risks of anesthesia including but not limited to:  - adverse reactions to medications - risk of airway placement if required - damage to eyes, teeth, lips or other oral mucosa - nerve damage due to positioning  - sore throat or hoarseness - Damage to heart, brain, nerves, lungs, other parts of body or loss of life  Patient voiced understanding and assent.)         Anesthesia Quick Evaluation

## 2024-03-18 NOTE — Discharge Instructions (Signed)

## 2024-03-20 ENCOUNTER — Ambulatory Visit: Payer: Self-pay

## 2024-03-20 ENCOUNTER — Encounter: Payer: Self-pay | Admitting: Podiatry

## 2024-03-20 ENCOUNTER — Other Ambulatory Visit: Payer: Self-pay

## 2024-03-20 ENCOUNTER — Ambulatory Visit: Payer: Self-pay | Admitting: Anesthesiology

## 2024-03-20 ENCOUNTER — Encounter
Admission: RE | Disposition: A | Discharge status: Home / Self Care | Payer: Self-pay | Source: Home / Self Care | Attending: Podiatry

## 2024-03-20 ENCOUNTER — Ambulatory Visit
Admission: RE | Admit: 2024-03-20 | Discharge: 2024-03-20 | Disposition: A | Discharge status: Home / Self Care | Attending: Podiatry | Admitting: Podiatry

## 2024-03-20 DIAGNOSIS — M2021 Hallux rigidus, right foot: Secondary | ICD-10-CM | POA: Insufficient documentation

## 2024-03-20 DIAGNOSIS — G4733 Obstructive sleep apnea (adult) (pediatric): Secondary | ICD-10-CM | POA: Diagnosis not present

## 2024-03-20 DIAGNOSIS — I10 Essential (primary) hypertension: Secondary | ICD-10-CM | POA: Diagnosis not present

## 2024-03-20 DIAGNOSIS — Z87891 Personal history of nicotine dependence: Secondary | ICD-10-CM | POA: Insufficient documentation

## 2024-03-20 DIAGNOSIS — M79671 Pain in right foot: Secondary | ICD-10-CM | POA: Diagnosis present

## 2024-03-20 HISTORY — DX: Unspecified osteoarthritis, unspecified site: M19.90

## 2024-03-20 HISTORY — DX: Lymphedema, not elsewhere classified: I89.0

## 2024-03-20 HISTORY — DX: Encounter for other preprocedural examination: Z01.818

## 2024-03-20 HISTORY — DX: Meralgia paresthetica, left lower limb: G57.12

## 2024-03-20 SURGERY — FUSION, JOINT, FOOT
Anesthesia: General | Site: Foot | Laterality: Right

## 2024-03-20 MED ORDER — LIDOCAINE HCL (CARDIAC) PF 100 MG/5ML IV SOSY
PREFILLED_SYRINGE | INTRAVENOUS | Status: DC | PRN
Start: 2024-03-20 — End: 2024-03-20
  Administered 2024-03-20: 100 mg via INTRATRACHEAL

## 2024-03-20 MED ORDER — SODIUM CHLORIDE 0.9 % IV SOLN
INTRAVENOUS | Status: DC | PRN
Start: 2024-03-20 — End: 2024-03-20

## 2024-03-20 MED ORDER — LIDOCAINE HCL (PF) 2 % IJ SOLN
INTRAMUSCULAR | Status: AC
  Filled 2024-03-20: qty 5

## 2024-03-20 MED ORDER — PROPOFOL 10 MG/ML IV BOLUS
INTRAVENOUS | Status: AC
  Filled 2024-03-20: qty 20

## 2024-03-20 MED ORDER — BUPIVACAINE LIPOSOME 1.3 % IJ SUSP
INTRAMUSCULAR | Status: DC | PRN
Start: 2024-03-20 — End: 2024-03-20
  Administered 2024-03-20: 10 mL

## 2024-03-20 MED ORDER — CEFAZOLIN SODIUM-DEXTROSE 2-4 GM/100ML-% IV SOLN
2.0000 g | INTRAVENOUS | Status: AC
  Administered 2024-03-20: 2 g via INTRAVENOUS

## 2024-03-20 MED ORDER — EPHEDRINE SULFATE (PRESSORS) 50 MG/ML IJ SOLN
INTRAMUSCULAR | Status: DC | PRN
Start: 2024-03-20 — End: 2024-03-20
  Administered 2024-03-20 (×2): 10 mg via INTRAVENOUS

## 2024-03-20 MED ORDER — OXYCODONE-ACETAMINOPHEN 5-325 MG PO TABS: 1.0000 | ORAL_TABLET | Freq: Four times a day (QID) | ORAL | 0 refills | Status: AC | PRN

## 2024-03-20 MED ORDER — BUPIVACAINE HCL (PF) 0.25 % IJ SOLN
INTRAMUSCULAR | Status: DC | PRN
  Administered 2024-03-20: 10 mL

## 2024-03-20 MED ORDER — PROPOFOL 10 MG/ML IV BOLUS
INTRAVENOUS | Status: DC | PRN
  Administered 2024-03-20: 200 mg via INTRAVENOUS

## 2024-03-20 MED ORDER — BUPIVACAINE LIPOSOME 1.3 % IJ SUSP
INTRAMUSCULAR | Status: AC
  Filled 2024-03-20: qty 10

## 2024-03-20 MED ORDER — ONDANSETRON HCL 4 MG/2ML IJ SOLN
INTRAMUSCULAR | Status: DC | PRN
  Administered 2024-03-20: 4 mg via INTRAVENOUS

## 2024-03-20 MED ORDER — MIDAZOLAM HCL 2 MG/2ML IJ SOLN
INTRAMUSCULAR | Status: AC
  Filled 2024-03-20: qty 2

## 2024-03-20 MED ORDER — DEXAMETHASONE SODIUM PHOSPHATE 4 MG/ML IJ SOLN
INTRAMUSCULAR | Status: AC
  Filled 2024-03-20: qty 1

## 2024-03-20 MED ORDER — ASPIRIN 81 MG PO TBEC: 81.0000 mg | DELAYED_RELEASE_TABLET | Freq: Two times a day (BID) | ORAL | 1 refills | Status: AC

## 2024-03-20 MED ORDER — DEXMEDETOMIDINE HCL IN NACL 80 MCG/20ML IV SOLN
INTRAVENOUS | Status: DC | PRN
  Administered 2024-03-20: 4 ug via INTRAVENOUS

## 2024-03-20 MED ORDER — CEFAZOLIN SODIUM-DEXTROSE 2-3 GM-%(50ML) IV SOLR
INTRAVENOUS | Status: AC
  Filled 2024-03-20: qty 50

## 2024-03-20 MED ORDER — FENTANYL CITRATE (PF) 100 MCG/2ML IJ SOLN
INTRAMUSCULAR | Status: DC | PRN
  Administered 2024-03-20: 100 ug via INTRAVENOUS

## 2024-03-20 MED ORDER — FENTANYL CITRATE (PF) 100 MCG/2ML IJ SOLN
INTRAMUSCULAR | Status: AC
  Filled 2024-03-20: qty 2

## 2024-03-20 MED ORDER — EPHEDRINE 5 MG/ML INJ
INTRAVENOUS | Status: AC
  Filled 2024-03-20: qty 5

## 2024-03-20 MED ORDER — ONDANSETRON HCL 4 MG/2ML IJ SOLN
INTRAMUSCULAR | Status: AC
  Filled 2024-03-20: qty 2

## 2024-03-20 MED ORDER — LACTATED RINGERS IV SOLN: INTRAVENOUS | Status: DC

## 2024-03-20 MED ORDER — DEXAMETHASONE SODIUM PHOSPHATE 4 MG/ML IJ SOLN
INTRAMUSCULAR | Status: DC | PRN
  Administered 2024-03-20: 4 mg via INTRAVENOUS

## 2024-03-20 MED ORDER — MIDAZOLAM HCL 5 MG/5ML IJ SOLN
INTRAMUSCULAR | Status: DC | PRN
  Administered 2024-03-20: 2 mg via INTRAVENOUS

## 2024-03-20 SURGICAL SUPPLY — 47 items
BENZOIN TINCTURE PRP APPL 2/3 (GAUZE/BANDAGES/DRESSINGS) ×1 IMPLANT
BIT DRILL 2 FAST STEP (BIT) IMPLANT
BLADE MED AGGRESSIVE (BLADE) IMPLANT
BLADE OSC/SAGITTAL MD 5.5X18 (BLADE) IMPLANT
BLADE SURG 15 STRL LF DISP TIS (BLADE) IMPLANT
BNDG COHESIVE 4X5 TAN STRL LF (GAUZE/BANDAGES/DRESSINGS) ×1 IMPLANT
BNDG ELASTIC 4X5.8 VLCR NS LF (GAUZE/BANDAGES/DRESSINGS) ×1 IMPLANT
BNDG ESMARCH 4X12 STRL LF (GAUZE/BANDAGES/DRESSINGS) ×1 IMPLANT
BNDG GAUZE DERMACEA FLUFF 4 (GAUZE/BANDAGES/DRESSINGS) ×1 IMPLANT
BNDG STRETCH 4X75 STRL LF (GAUZE/BANDAGES/DRESSINGS) ×1 IMPLANT
BOOT STEPPER DURA LG (SOFTGOODS) IMPLANT
BOOT STEPPER DURA MED (SOFTGOODS) IMPLANT
BOOT STEPPER DURA SM (SOFTGOODS) IMPLANT
BOOT WALKER MEDIUM (SOFTGOODS) IMPLANT
CANISTER SUCT 1200ML W/VALVE (MISCELLANEOUS) ×1 IMPLANT
COVER LIGHT HANDLE UNIVERSAL (MISCELLANEOUS) ×2 IMPLANT
CUFF TOURN SGL QUICK 18X4 (TOURNIQUET CUFF) IMPLANT
DRAPE FLUOR MINI C-ARM 54X84 (DRAPES) ×1 IMPLANT
DURAPREP 26ML APPLICATOR (WOUND CARE) ×1 IMPLANT
ELECTRODE REM PT RTRN 9FT ADLT (ELECTROSURGICAL) ×1 IMPLANT
GAUZE SPONGE 4X4 12PLY STRL (GAUZE/BANDAGES/DRESSINGS) ×1 IMPLANT
GAUZE XEROFORM 1X8 LF (GAUZE/BANDAGES/DRESSINGS) ×1 IMPLANT
GLOVE BIOGEL PI IND STRL 8 (GLOVE) ×2 IMPLANT
GLOVE SURG SS PI 7.5 STRL IVOR (GLOVE) ×2 IMPLANT
GOWN SPEC L4 XLG W/TWL (GOWN DISPOSABLE) ×1 IMPLANT
GOWN STRL REUS W/ TWL LRG LVL3 (GOWN DISPOSABLE) ×1 IMPLANT
KIT TURNOVER KIT A (KITS) ×1 IMPLANT
KWIRE ACE 1.6X6 (WIRE) IMPLANT
KWIRE DBL END TROCAR 6X.045 (WIRE) IMPLANT
KWIRE DBL END TROCAR 6X.062 (WIRE) IMPLANT
NS IRRIG 500ML POUR BTL (IV SOLUTION) ×1 IMPLANT
PACK EXTREMITY ARMC (MISCELLANEOUS) ×1 IMPLANT
PENCIL SMOKE EVACUATOR (MISCELLANEOUS) ×1 IMPLANT
PLATE LOCK 1ST RT MTP (Plate) IMPLANT
RASP SM TEAR CROSS CUT (RASP) IMPLANT
SCREW PEG 2.5X16 NONLOCK (Screw) IMPLANT
SCREW PEG 2.5X18 NONLOCK (Screw) IMPLANT
SCREW PEG LOCK 2.5X12 (Screw) IMPLANT
SCREW PEG LOCK 2.5X14 (Peg) IMPLANT
SCREW PEG LOCK 2.5X20 (Peg) IMPLANT
STOCKINETTE IMPERVIOUS LG (DRAPES) ×1 IMPLANT
STRIP CLOSURE SKIN 1/4X4 (GAUZE/BANDAGES/DRESSINGS) ×1 IMPLANT
SUT MNCRL+ 5-0 UNDYED PC-3 (SUTURE) IMPLANT
SUT VIC AB 2-0 SH 27XBRD (SUTURE) IMPLANT
SUT VIC AB 3-0 SH 27X BRD (SUTURE) IMPLANT
SUT VIC AB 4-0 SH 27XANBCTRL (SUTURE) IMPLANT
SUTURE MNCRL 4-0 27XMF (SUTURE) ×1 IMPLANT

## 2024-03-20 NOTE — Anesthesia Procedure Notes (Signed)
 Procedure Name: LMA Insertion Date/Time: 03/20/2024 2:03 PM  Performed by: Myra Lawless, CRNAPre-anesthesia Checklist: Patient identified, Patient being monitored, Timeout performed, Emergency Drugs available and Suction available Patient Re-evaluated:Patient Re-evaluated prior to induction Oxygen Delivery Method: Circle system utilized Preoxygenation: Pre-oxygenation with 100% oxygen Induction Type: IV induction Ventilation: Mask ventilation without difficulty LMA: LMA inserted LMA Size: 4.0 Tube type: Oral Number of attempts: 1 Placement Confirmation: positive ETCO2 and breath sounds checked- equal and bilateral Tube secured with: Tape Dental Injury: Teeth and Oropharynx as per pre-operative assessment

## 2024-03-20 NOTE — Op Note (Signed)
 Operative note   Surgeon:Piper Hassebrock Ashley    Assistant: Greig Blush, D.P.M.    Preop diagnosis: Hallux rigidus right first MTPJ    Postop diagnosis: Same    Procedure: 1.  Arthrodesis right first MTPJ 2.  Intraoperative fluoroscopy without assistance of radiologist    EBL: Minimal    Anesthesia:local and general.  Local consisted of a one-to-one mixture of 0.25% plain bupivacaine and Exparel long-acting anesthetic.  A total of 20 cc was used    Hemostasis: Mid calf tourniquet inflated to 200 mmHg for approximately 45 minutes    Specimen: None    Complications: None    Operative indications:Laurice GISSELL BARRA is an 68 y.o. that presents today for surgical intervention.  The risks/benefits/alternatives/complications have been discussed and consent has been given.    Procedure:  Patient was brought into the OR and placed on the operating table in thesupine position. After anesthesia was obtained theright lower extremity was prepped and draped in usual sterile fashion.  Attention was directed to the dorsal medial right first MTPJ where a dorsal medial incision was performed.  Sharp and blunt dissection carried down to the capsule.  A longitudinal capsulotomy was performed.  At this time the head of the metatarsal and base of the proximal phalanx were exposed.  Large amount of cartilaginous damage was noted especially to the plantar one half of the metatarsal head.  At this time with a cup and cone reamer I was able to remove all articular cartilage down to the subchondral bone plate.  Further contouring was then performed removing any particular spurring and smoothed with a power rasp.  Next the joint was prepped with a 2.0 mm drill bit on both sides.  The toe was held in a neutral 5 degree valgus 5 degree dorsiflexed position.  A dorsal plate from the AL PS [employer] first MTPJ fusion plate set was applied with 2.5 millimeter screws proximal and distal.  Good alignment and stability were noted at this  time.  Multiple views of fluoroscopy noted good alignment.  At this time all wounds were flushed with copious amounts of irrigation.  Closure was performed with a 3-0 Vicryl for the deeper tissue, 4-0 Vicryl the subcutaneous tissue and a 4-0 Monocryl for the skin.  Patient was placed in a well compressive sterile dressing with the equalizer walker boot.    Patient tolerated the procedure and anesthesia well.  Was transported from the OR to the PACU with all vital signs stable and vascular status intact. To be discharged per routine protocol.  Will follow up in approximately 1 week in the outpatient clinic.

## 2024-03-20 NOTE — H&P (Signed)
 HISTORY AND PHYSICAL INTERVAL NOTE:  03/20/2024  1:47 PM  Caitlin Romero  has presented today for surgery, with the diagnosis of Right foot pain M79.671 Hallux rigidus, right foot M20.21.  The various methods of treatment have been discussed with the patient.  No guarantees were given.  After consideration of risks, benefits and other options for treatment, the patient has consented to surgery.  I have reviewed the patients' chart and labs.     A history and physical examination was performed in my office.  The patient was reexamined.  There have been no changes to this history and physical examination.  Ashley Soulier A

## 2024-03-20 NOTE — Anesthesia Postprocedure Evaluation (Signed)
 Anesthesia Post Note  Patient: Caitlin Romero  Procedure(s) Performed: FUSION, JOINT, FOOT (Right: Foot)  Patient location during evaluation: PACU Anesthesia Type: General Level of consciousness: awake and alert Pain management: pain level controlled Vital Signs Assessment: post-procedure vital signs reviewed and stable Respiratory status: spontaneous breathing, nonlabored ventilation, respiratory function stable and patient connected to nasal cannula oxygen Cardiovascular status: blood pressure returned to baseline and stable Postop Assessment: no apparent nausea or vomiting Anesthetic complications: no   No notable events documented.   Last Vitals:  Vitals:   03/20/24 1519 03/20/24 1530  BP: (!) 107/52 (!) 105/57  Pulse: 72   Resp: 18   Temp: 36.6 C 36.7 C  SpO2: 96%     Last Pain:  Vitals:   03/20/24 1530  TempSrc:   PainSc: 0-No pain                 Latravion Graves C Ieesha Abbasi

## 2024-03-20 NOTE — Transfer of Care (Signed)
 Immediate Anesthesia Transfer of Care Note  Patient: Caitlin Romero  Procedure(s) Performed: FUSION, JOINT, FOOT (Right: Foot)  Patient Location: PACU  Anesthesia Type: General  Level of Consciousness: awake, alert  and patient cooperative  Airway and Oxygen Therapy: Patient Spontanous Breathing and Patient connected to supplemental oxygen  Post-op Assessment: Post-op Vital signs reviewed, Patient's Cardiovascular Status Stable, Respiratory Function Stable, Patent Airway and No signs of Nausea or vomiting  Post-op Vital Signs: Reviewed and stable  Complications: No notable events documented.

## 2024-03-21 ENCOUNTER — Encounter: Payer: Self-pay | Admitting: Podiatry

## 2024-05-08 ENCOUNTER — Ambulatory Visit

## 2024-05-16 ENCOUNTER — Ambulatory Visit: Attending: Internal Medicine

## 2024-05-16 DIAGNOSIS — I10 Essential (primary) hypertension: Secondary | ICD-10-CM | POA: Diagnosis not present

## 2024-05-16 DIAGNOSIS — G4733 Obstructive sleep apnea (adult) (pediatric): Secondary | ICD-10-CM | POA: Insufficient documentation
